# Patient Record
Sex: Male | Born: 1970
Health system: Southern US, Community
[De-identification: ages and names within clinical notes are randomized; demographics above are authoritative.]

---

## 2011-06-10 ENCOUNTER — Ambulatory Visit: Payer: PRIVATE HEALTH INSURANCE

## 2011-06-10 ENCOUNTER — Ambulatory Visit (INDEPENDENT_AMBULATORY_CARE_PROVIDER_SITE_OTHER): Payer: PRIVATE HEALTH INSURANCE | Admitting: Family Medicine

## 2011-06-10 DIAGNOSIS — R059 Cough, unspecified: Secondary | ICD-10-CM

## 2011-06-10 DIAGNOSIS — R05 Cough: Secondary | ICD-10-CM

## 2011-06-10 DIAGNOSIS — J309 Allergic rhinitis, unspecified: Secondary | ICD-10-CM

## 2011-06-10 DIAGNOSIS — M79674 Pain in right toe(s): Secondary | ICD-10-CM

## 2011-06-10 DIAGNOSIS — M79609 Pain in unspecified limb: Secondary | ICD-10-CM

## 2011-06-10 DIAGNOSIS — S90129A Contusion of unspecified lesser toe(s) without damage to nail, initial encounter: Secondary | ICD-10-CM

## 2011-06-10 NOTE — Patient Instructions (Addendum)
Try over the counter zyrtec or allegra for allergies, can try zantac for possible reflux.  If cough/chest symptoms not improved in next 2-3 weeks, return to clinic.  There was not an obvious fracture of your toe, but a small one may be present.  Keep toe buddy taped to the 3rd toe for the next 3-4 weeks, and a hard soled shoe if possible.  Return to the clinic or go to the nearest emergency room if any of your symptoms worsen or new symptoms occur.

## 2011-06-10 NOTE — Progress Notes (Signed)
  Subjective:    Patient ID: Danny Frost, male    DOB: November 14, 1970, 41 y.o.   MRN: [id number]  HPI Danny Frost is a 41 y.o. male  Right 4th toe pain, jammed on side/top of bed frame last night.  Noticed bruising this am.  Tx: ibuprofen, elevated.  Chest congestion x 1 month.  Back exercising, hx of dust allergies and nasal congestion.  Occasional cough and clearing throat. Not SOB, or wheezing with exercise.  Notices more at rest. Clearing of throat.  Denies heartburn. No chest pain.   Tx: none.     Review of Systems  Constitutional: Negative for fever, chills, activity change and appetite change.  HENT: Positive for congestion, postnasal drip and sinus pressure. Negative for hearing loss, ear pain, sore throat and sneezing.   Respiratory: Positive for cough. Negative for shortness of breath and wheezing.        Dry cough.  Cardiovascular: Negative for chest pain and leg swelling.  Musculoskeletal:       R 4th toe pain.       Objective:   Physical Exam  Constitutional: He is oriented to person, place, and time. He appears well-developed and well-nourished.  HENT:  Head: Normocephalic and atraumatic.  Right Ear: Hearing, tympanic membrane and external ear normal.  Left Ear: Hearing, tympanic membrane and external ear normal.  Nose: Mucosal edema and rhinorrhea present. Right sinus exhibits no maxillary sinus tenderness and no frontal sinus tenderness. Left sinus exhibits no maxillary sinus tenderness and no frontal sinus tenderness.  Mouth/Throat: Oropharynx is clear and moist.  Eyes: Conjunctivae and EOM are normal. Pupils are equal, round, and reactive to light.  Neck: Normal range of motion. Neck supple.  Cardiovascular: Normal rate, regular rhythm, normal heart sounds and intact distal pulses.   No murmur heard. Pulmonary/Chest: Effort normal and breath sounds normal. No respiratory distress. He has no wheezes. He has no rales.  Musculoskeletal:       Right foot: He exhibits  tenderness and bony tenderness. He exhibits normal range of motion.       Ecchymosis, sts mid 4th toe.  Neurological: He is alert and oriented to person, place, and time.  Skin: Skin is warm and dry.  Psychiatric: He has a normal mood and affect. His behavior is normal.   [facility] reading (PRIMARY) by  Dr. Neva Seat:  R 4th toe - possible faint/nondispalced fx of distal phalyn.      Assessment & Plan:  Danny Frost is a 41 y.o. male 1. Allergic rhinitis    2. Cough    3. Toe pain, right  DG Toe 4th Right   Toe contusion vs.subtle ND fx.  - buddy tape to 3rd for 3-4 weeks, hard soled shoe, tylenol otc prn, XR to overread.  Suspect cough/throat clearing PND with AR vs LPR.  Trial otc allegra or zyrtec QD +/- zantac.  Recheck 2-3 weeks if not improving, sooner if worse.

## 2013-02-26 IMAGING — CR DG TOE 4TH 2+V*R*
1 series · 1 of 1 positions shown · non-contrast
Comparison: None.

CLINICAL DATA: Pain and bruising in the fourth toe following injury
yesterday.

RIGHT FOURTH TOE

[AP]
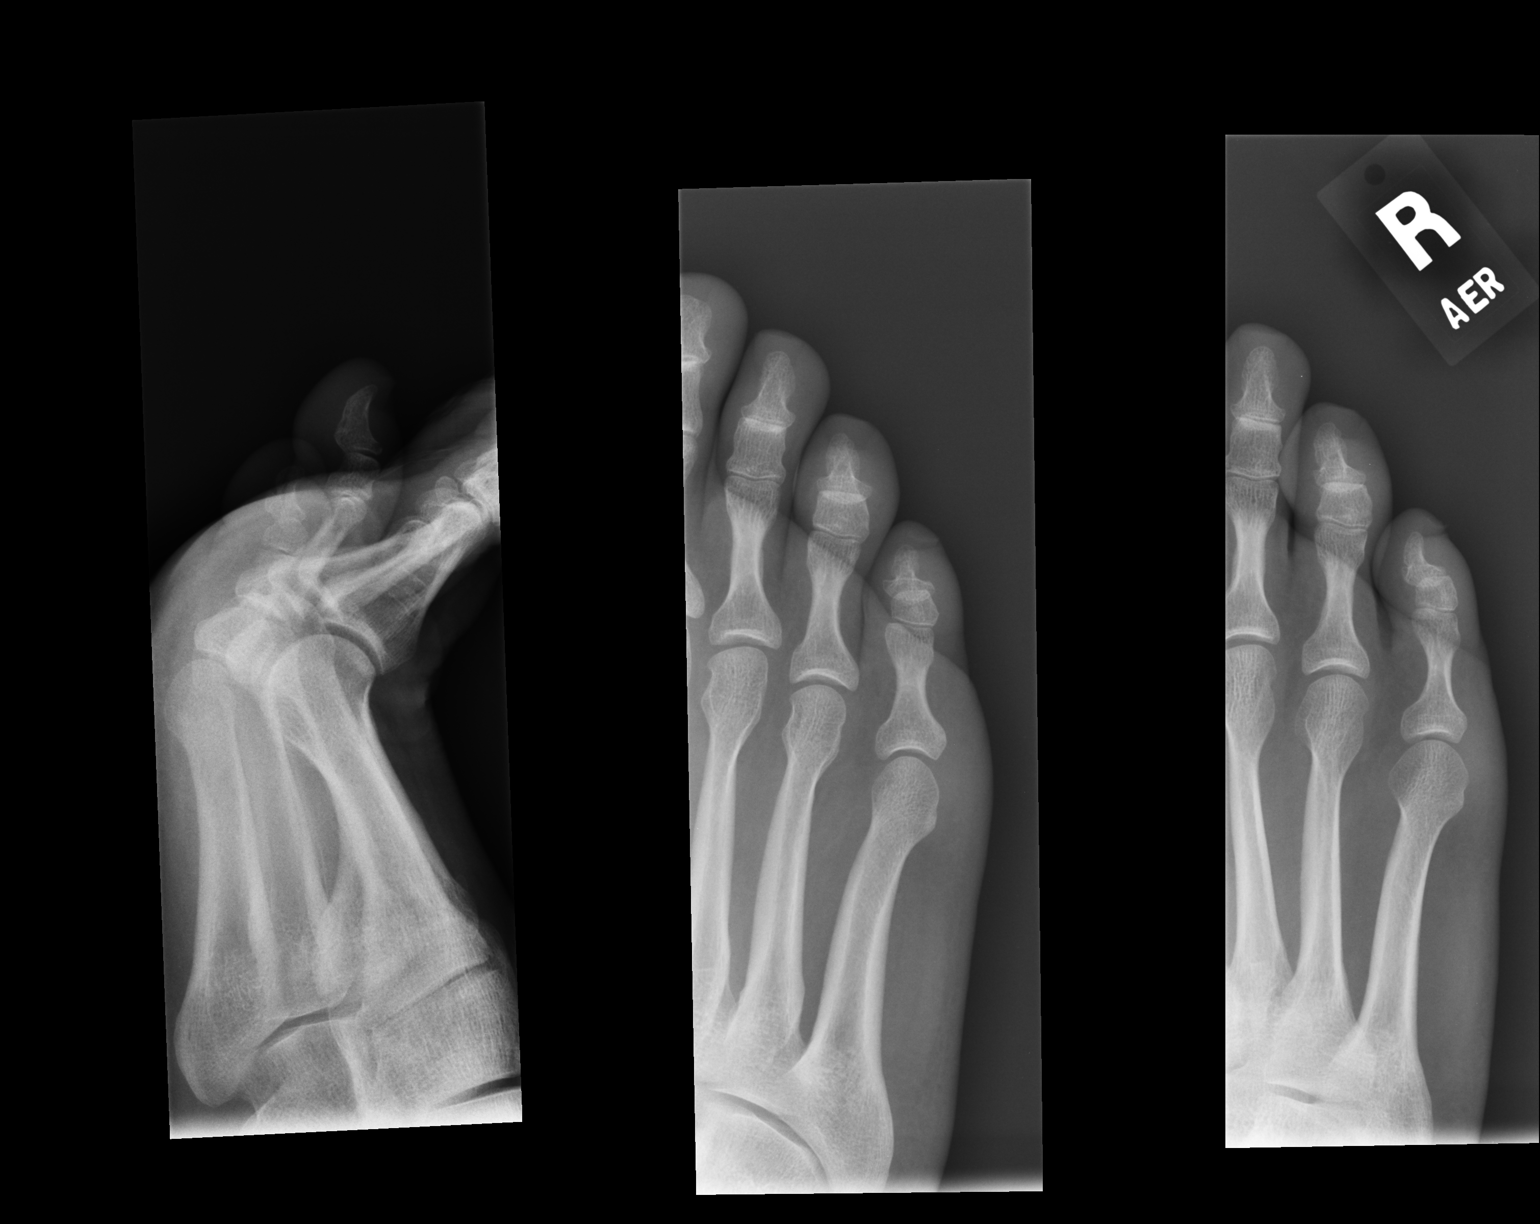

[1 of 1 positions shown; findings below may reference images not displayed]

FINDINGS: There is soft tissue swelling in the fourth toe.  The
mineralization and alignment are normal.  There is no evidence of
acute fracture or dislocation.
IMPRESSION: No acute osseous findings identified.

## 2015-08-22 DIAGNOSIS — H43813 Vitreous degeneration, bilateral: Secondary | ICD-10-CM | POA: Diagnosis not present

## 2015-08-22 DIAGNOSIS — H43393 Other vitreous opacities, bilateral: Secondary | ICD-10-CM | POA: Diagnosis not present

## 2015-08-22 DIAGNOSIS — H5319 Other subjective visual disturbances: Secondary | ICD-10-CM | POA: Diagnosis not present

## 2015-08-22 DIAGNOSIS — H00025 Hordeolum internum left lower eyelid: Secondary | ICD-10-CM | POA: Diagnosis not present

## 2015-09-22 DIAGNOSIS — H5319 Other subjective visual disturbances: Secondary | ICD-10-CM | POA: Diagnosis not present

## 2015-09-22 DIAGNOSIS — H43813 Vitreous degeneration, bilateral: Secondary | ICD-10-CM | POA: Diagnosis not present

## 2015-09-22 DIAGNOSIS — H43393 Other vitreous opacities, bilateral: Secondary | ICD-10-CM | POA: Diagnosis not present

## 2015-09-22 DIAGNOSIS — H00025 Hordeolum internum left lower eyelid: Secondary | ICD-10-CM | POA: Diagnosis not present

## 2015-11-14 DIAGNOSIS — Z0000 Encounter for general adult medical examination without abnormal findings: Secondary | ICD-10-CM | POA: Diagnosis not present

## 2015-11-14 DIAGNOSIS — Z125 Encounter for screening for malignant neoplasm of prostate: Secondary | ICD-10-CM | POA: Diagnosis not present

## 2015-11-21 DIAGNOSIS — Z0001 Encounter for general adult medical examination with abnormal findings: Secondary | ICD-10-CM | POA: Diagnosis not present

## 2016-01-23 DIAGNOSIS — H01003 Unspecified blepharitis right eye, unspecified eyelid: Secondary | ICD-10-CM | POA: Diagnosis not present

## 2016-01-23 DIAGNOSIS — H40011 Open angle with borderline findings, low risk, right eye: Secondary | ICD-10-CM | POA: Diagnosis not present

## 2016-01-23 DIAGNOSIS — H40012 Open angle with borderline findings, low risk, left eye: Secondary | ICD-10-CM | POA: Diagnosis not present

## 2016-01-23 DIAGNOSIS — H00025 Hordeolum internum left lower eyelid: Secondary | ICD-10-CM | POA: Diagnosis not present

## 2016-01-23 DIAGNOSIS — H40013 Open angle with borderline findings, low risk, bilateral: Secondary | ICD-10-CM | POA: Diagnosis not present

## 2016-07-17 DIAGNOSIS — H43813 Vitreous degeneration, bilateral: Secondary | ICD-10-CM | POA: Diagnosis not present

## 2016-07-17 DIAGNOSIS — H43393 Other vitreous opacities, bilateral: Secondary | ICD-10-CM | POA: Diagnosis not present

## 2016-07-17 DIAGNOSIS — H40012 Open angle with borderline findings, low risk, left eye: Secondary | ICD-10-CM | POA: Diagnosis not present

## 2016-07-17 DIAGNOSIS — H01003 Unspecified blepharitis right eye, unspecified eyelid: Secondary | ICD-10-CM | POA: Diagnosis not present

## 2016-07-17 DIAGNOSIS — H40013 Open angle with borderline findings, low risk, bilateral: Secondary | ICD-10-CM | POA: Diagnosis not present

## 2016-07-17 DIAGNOSIS — H40011 Open angle with borderline findings, low risk, right eye: Secondary | ICD-10-CM | POA: Diagnosis not present

## 2016-10-30 DIAGNOSIS — L814 Other melanin hyperpigmentation: Secondary | ICD-10-CM | POA: Diagnosis not present

## 2016-10-30 DIAGNOSIS — D1801 Hemangioma of skin and subcutaneous tissue: Secondary | ICD-10-CM | POA: Diagnosis not present

## 2016-10-30 DIAGNOSIS — D225 Melanocytic nevi of trunk: Secondary | ICD-10-CM | POA: Diagnosis not present

## 2016-10-30 DIAGNOSIS — L821 Other seborrheic keratosis: Secondary | ICD-10-CM | POA: Diagnosis not present

## 2016-11-14 DIAGNOSIS — Z125 Encounter for screening for malignant neoplasm of prostate: Secondary | ICD-10-CM | POA: Diagnosis not present

## 2016-11-14 DIAGNOSIS — Z0001 Encounter for general adult medical examination with abnormal findings: Secondary | ICD-10-CM | POA: Diagnosis not present

## 2016-12-05 ENCOUNTER — Encounter: Payer: Self-pay | Admitting: Hematology and Oncology

## 2016-12-05 ENCOUNTER — Telehealth: Payer: Self-pay | Admitting: Hematology and Oncology

## 2016-12-05 NOTE — Telephone Encounter (Signed)
Appt has been scheduled for the Danny Frost to see Dr. Pamelia HoitGudena on 9/13 at 4pm. [facility] the Danny Frost. Letter mailed.

## 2016-12-20 ENCOUNTER — Encounter: Payer: Self-pay | Admitting: Hematology and Oncology

## 2016-12-20 ENCOUNTER — Ambulatory Visit (HOSPITAL_BASED_OUTPATIENT_CLINIC_OR_DEPARTMENT_OTHER): Payer: Self-pay | Admitting: Hematology and Oncology

## 2016-12-20 DIAGNOSIS — D72819 Decreased white blood cell count, unspecified: Secondary | ICD-10-CM

## 2016-12-20 DIAGNOSIS — D696 Thrombocytopenia, unspecified: Secondary | ICD-10-CM

## 2016-12-20 NOTE — Assessment & Plan Note (Signed)
Mild thrombocytopenia platelet count 134 Mild leukopenia white blood cell count 3.7 and ANC 2.2  I discussed with the patient extensively the differential diagnosis for both of these conditions. The differential diagnosis includes medications, infections, autoimmune conditions, bone marrow disorders.  Because the abnormality is extremely small, I recommended watchful monitoring at this time. Patient is completely asymptomatic and does not seem to have any problems or concerns.  Return to clinic in 3 months with labs and follow-up.

## 2016-12-20 NOTE — Progress Notes (Addendum)
[facility] CONSULT NOTE  No care team member to display  CHIEF COMPLAINTS/PURPOSE OF CONSULTATION:  Thrombocytopenia and leukopenia  HISTORY OF PRESENTING ILLNESS:  Danny Frost 46 y.o. male is here because of recent diagnosis of mild thrombocytopenia and mild leukopenia. Patient had blood work every year. This year blood work was done in August which revealed a white count 3.7 and a platelet count of 134. Year ago both of these not values were normal. Because of these 2 he was sent to us for further evaluation. Patient has a history of asthma but other than that is fairly healthy. Does not take many medications. Denies any recent illnesses or infections.  I reviewed her records extensively and collaborated the history with the patient.   MEDICAL HISTORY:  Asthma SURGICAL HISTORY: No prior surgeries SOCIAL HISTORY: Denies any tobacco alcohol integration drug use FAMILY HISTORY: No family history of blood disorders ALLERGIES:  has No Known Allergies.  MEDICATIONS: Fish oil, fluticasone, Mucinex, Delsym, ketoconazole 2% cream REVIEW OF SYSTEMS:   Constitutional: Denies fevers, chills or abnormal night sweats Eyes: Denies blurriness of vision, double vision or watery eyes Ears, nose, mouth, throat, and face: Denies mucositis or sore throat Respiratory: Denies cough, dyspnea or wheezes Cardiovascular: Denies palpitation, chest discomfort or lower extremity swelling Gastrointestinal:  Denies nausea, heartburn or change in bowel habits Skin: Denies abnormal skin rashes Lymphatics: Denies new lymphadenopathy or easy bruising Neurological:Denies numbness, tingling or new weaknesses Behavioral/Psych: Mood is stable, no new changes   All other systems were reviewed with the patient and are negative.  PHYSICAL EXAMINATION: ECOG PERFORMANCE STATUS: 0 - Asymptomatic  Vitals:   12/20/16 1615  BP: (!) 93/51  Pulse: 65  Resp: 18  Temp: 98.2 F (36.8 C)  SpO2: 98%    Filed Weights   12/20/16 1615  Weight: 191 lb 1.6 oz (86.7 kg)    GENERAL:alert, no distress and comfortable SKIN: skin color, texture, turgor are normal, no rashes or significant lesions EYES: normal, conjunctiva are pink and non-injected, sclera clear OROPHARYNX:no exudate, no erythema and lips, buccal mucosa, and tongue normal  NECK: supple, thyroid normal size, non-tender, without nodularity LYMPH:  no palpable lymphadenopathy in the cervical, axillary or inguinal LUNGS: clear to auscultation and percussion with normal breathing effort HEART: regular rate & rhythm and no murmurs and no lower extremity edema ABDOMEN:abdomen soft, non-tender and normal bowel sounds Musculoskeletal:no cyanosis of digits and no clubbing  PSYCH: alert & oriented x 3 with fluent speech NEURO: no focal motor/sensory deficits  LABORATORY DATA:  CBC is indicative of a white count of 3.7, ANC 2.2 in August 2018, platelets 134  ASSESSMENT AND PLAN:  Thrombocytopenia (HCC) Mild thrombocytopenia platelet count 134 Mild leukopenia white blood cell count 3.7 and ANC 2.2  I discussed with the patient extensively the differential diagnosis for both of these conditions. The differential diagnosis includes medications, infections, autoimmune conditions, bone marrow disorders.  Because the abnormality is extremely small, I recommended watchful monitoring at this time. Patient is completely asymptomatic and does not seem to have any problems or concerns.  Return to clinic in 3 months with labs and follow-up.   All questions were answered. The patient knows to call the clinic with any problems, questions or concerns.    Sabas SousGudena, [name], MD 12/20/16

## 2017-01-02 DIAGNOSIS — H00025 Hordeolum internum left lower eyelid: Secondary | ICD-10-CM | POA: Diagnosis not present

## 2017-01-02 DIAGNOSIS — H00021 Hordeolum internum right upper eyelid: Secondary | ICD-10-CM | POA: Diagnosis not present

## 2017-01-02 DIAGNOSIS — H01003 Unspecified blepharitis right eye, unspecified eyelid: Secondary | ICD-10-CM | POA: Diagnosis not present

## 2017-02-15 DIAGNOSIS — H00025 Hordeolum internum left lower eyelid: Secondary | ICD-10-CM | POA: Diagnosis not present

## 2017-02-15 DIAGNOSIS — H40011 Open angle with borderline findings, low risk, right eye: Secondary | ICD-10-CM | POA: Diagnosis not present

## 2017-02-15 DIAGNOSIS — H40013 Open angle with borderline findings, low risk, bilateral: Secondary | ICD-10-CM | POA: Diagnosis not present

## 2017-02-15 DIAGNOSIS — H40012 Open angle with borderline findings, low risk, left eye: Secondary | ICD-10-CM | POA: Diagnosis not present

## 2017-02-15 DIAGNOSIS — H00021 Hordeolum internum right upper eyelid: Secondary | ICD-10-CM | POA: Diagnosis not present

## 2017-02-15 DIAGNOSIS — H01003 Unspecified blepharitis right eye, unspecified eyelid: Secondary | ICD-10-CM | POA: Diagnosis not present

## 2017-03-21 ENCOUNTER — Ambulatory Visit (HOSPITAL_BASED_OUTPATIENT_CLINIC_OR_DEPARTMENT_OTHER): Payer: Self-pay | Admitting: Hematology and Oncology

## 2017-03-21 ENCOUNTER — Other Ambulatory Visit (HOSPITAL_BASED_OUTPATIENT_CLINIC_OR_DEPARTMENT_OTHER): Payer: Self-pay

## 2017-03-21 DIAGNOSIS — D696 Thrombocytopenia, unspecified: Secondary | ICD-10-CM

## 2017-03-21 LAB — CBC WITH DIFFERENTIAL/PLATELET
BASO%: 0.5 % (ref 0.0–2.0)
Basophils Absolute: 0 10*3/uL (ref 0.0–0.1)
EOS%: 2.3 % (ref 0.0–7.0)
Eosinophils Absolute: 0.1 10*3/uL (ref 0.0–0.5)
HCT: 45.5 % (ref 38.4–49.9)
HGB: 15.3 g/dL (ref 13.0–17.1)
LYMPH%: 28.2 % (ref 14.0–49.0)
MCH: 29.6 pg (ref 27.2–33.4)
MCHC: 33.7 g/dL (ref 32.0–36.0)
MCV: 87.9 fL (ref 79.3–98.0)
MONO#: 0.4 10*3/uL (ref 0.1–0.9)
MONO%: 8 % (ref 0.0–14.0)
NEUT%: 61 % (ref 39.0–75.0)
NEUTROS ABS: 2.8 10*3/uL (ref 1.5–6.5)
Platelets: 137 10*3/uL — ABNORMAL LOW (ref 140–400)
RBC: 5.18 10*6/uL (ref 4.20–5.82)
RDW: 13 % (ref 11.0–14.6)
WBC: 4.6 10*3/uL (ref 4.0–10.3)
lymph#: 1.3 10*3/uL (ref 0.9–3.3)

## 2017-03-21 NOTE — Assessment & Plan Note (Signed)
Mild thrombocytopenia platelet count 134 Mild leukopenia white blood cell count 3.7 and ANC 2.2  The differential diagnosis includes medications, infections, autoimmune conditions, bone marrow disorders.  Because the abnormality is extremely small, I recommended watchful monitoring at this time. Patient is completely asymptomatic and does not seem to have any problems or concerns.  Return to clinic in 3 months with labs and follow-up.

## 2017-03-21 NOTE — Progress Notes (Signed)
Patient Care Team: Patient, No Pcp Per as PCP - General (General Practice)  DIAGNOSIS:  Encounter Diagnosis  Name Primary?  . Thrombocytopenia ([place])    CHIEF COMPLIANT: Follow-up of thrombocytopenia and mild leukopenia  INTERVAL HISTORY: Danny Frost is a 46 year old with above-mentioned history of mild thrombus cytopenia who is here for 41-monthfollow-up.  He has not noticed any new symptoms or concerns.  REVIEW OF SYSTEMS:   Constitutional: Denies fevers, chills or abnormal weight loss Eyes: Denies blurriness of vision Ears, nose, mouth, throat, and face: Denies mucositis or sore throat Respiratory: Denies cough, dyspnea or wheezes Cardiovascular: Denies palpitation, chest discomfort Gastrointestinal:  Denies nausea, heartburn or change in bowel habits Skin: Denies abnormal skin rashes Lymphatics: Denies new lymphadenopathy or easy bruising Neurological:Denies numbness, tingling or new weaknesses Behavioral/Psych: Mood is stable, no new changes  Extremities: No lower extremity edema  All other systems were reviewed with the patient and are negative.  I have reviewed the past medical history, past surgical history, social history and family history with the patient and they are unchanged from previous note.  ALLERGIES:  has No Known Allergies.  MEDICATIONS:  No current outpatient medications on file.   No current facility-administered medications for this visit.     PHYSICAL EXAMINATION: ECOG PERFORMANCE STATUS: 0 - Asymptomatic  Vitals:   03/21/17 1049  BP: 111/62  Pulse: 72  Resp: 20  Temp: 97.8 F (36.6 C)  SpO2: 99%   Filed Weights   03/21/17 1049  Weight: 195 lb 14.4 oz (88.9 kg)    GENERAL:alert, no distress and comfortable SKIN: skin color, texture, turgor are normal, no rashes or significant lesions EYES: normal, Conjunctiva are pink and non-injected, sclera clear OROPHARYNX:no exudate, no erythema and lips, buccal mucosa, and tongue normal    NECK: supple, thyroid normal size, non-tender, without nodularity LYMPH:  no palpable lymphadenopathy in the cervical, axillary or inguinal LUNGS: clear to auscultation and percussion with normal breathing effort HEART: regular rate & rhythm and no murmurs and no lower extremity edema ABDOMEN:abdomen soft, non-tender and normal bowel sounds MUSCULOSKELETAL:no cyanosis of digits and no clubbing  NEURO: alert & oriented x 3 with fluent speech, no focal motor/sensory deficits EXTREMITIES: No lower extremity edema  LABORATORY DATA:  I have reviewed the data as listed   Chemistry   No results found for: NA, K, CL, CO2, BUN, CREATININE, GLU No results found for: CALCIUM, ALKPHOS, AST, ALT, BILITOT     Lab Results  Component Value Date   WBC 4.6 03/21/2017   HGB 15.3 03/21/2017   HCT 45.5 03/21/2017   MCV 87.9 03/21/2017   PLT 137 (L) 03/21/2017   NEUTROABS 2.8 03/21/2017    ASSESSMENT & PLAN:  Thrombocytopenia (HCC) Mild thrombocytopenia platelet count 134 Mild leukopenia white blood cell count 3.7 and ANC 2.2  Blood work review 03/21/2017: Platelet count 137 stable WBC count 4.6, ANC 2.8 normal  The differential diagnosis includes medications, infections, autoimmune conditions, bone marrow disorders.  Because the abnormality is extremely small, I recommended watchful monitoring at this time. Patient is completely asymptomatic and does not seem to have any problems or concerns.  I instructed him to follow with his primary care physician once a year for blood work.  If the platelet count drops below 100 I would like to see him back and we will consider doing a bone marrow biopsy at that time.  Return to clinic on an as-needed basis. I spent 15 minutes talking to the patient of  which more than half was spent in counseling and coordination of care.  No orders of the defined types were placed in this encounter.  The patient has a good understanding of the overall plan. he agrees  with it. he will call with any problems that may develop before the next visit here.   Rulon Eisenmenger, MD 03/21/17

## 2017-06-19 DIAGNOSIS — R5383 Other fatigue: Secondary | ICD-10-CM | POA: Diagnosis not present

## 2017-06-19 DIAGNOSIS — R300 Dysuria: Secondary | ICD-10-CM | POA: Diagnosis not present

## 2017-07-18 DIAGNOSIS — H47321 Drusen of optic disc, right eye: Secondary | ICD-10-CM | POA: Diagnosis not present

## 2017-07-18 DIAGNOSIS — H40013 Open angle with borderline findings, low risk, bilateral: Secondary | ICD-10-CM | POA: Diagnosis not present

## 2017-07-18 DIAGNOSIS — H43813 Vitreous degeneration, bilateral: Secondary | ICD-10-CM | POA: Diagnosis not present

## 2017-07-18 DIAGNOSIS — H524 Presbyopia: Secondary | ICD-10-CM | POA: Diagnosis not present

## 2017-07-18 DIAGNOSIS — H01003 Unspecified blepharitis right eye, unspecified eyelid: Secondary | ICD-10-CM | POA: Diagnosis not present

## 2017-10-31 DIAGNOSIS — D225 Melanocytic nevi of trunk: Secondary | ICD-10-CM | POA: Diagnosis not present

## 2017-10-31 DIAGNOSIS — D2339 Other benign neoplasm of skin of other parts of face: Secondary | ICD-10-CM | POA: Diagnosis not present

## 2017-10-31 DIAGNOSIS — L738 Other specified follicular disorders: Secondary | ICD-10-CM | POA: Diagnosis not present

## 2017-10-31 DIAGNOSIS — D485 Neoplasm of uncertain behavior of skin: Secondary | ICD-10-CM | POA: Diagnosis not present

## 2017-10-31 DIAGNOSIS — D2239 Melanocytic nevi of other parts of face: Secondary | ICD-10-CM | POA: Diagnosis not present

## 2017-11-18 DIAGNOSIS — R5383 Other fatigue: Secondary | ICD-10-CM | POA: Diagnosis not present

## 2017-11-18 DIAGNOSIS — Z0000 Encounter for general adult medical examination without abnormal findings: Secondary | ICD-10-CM | POA: Diagnosis not present

## 2017-11-18 DIAGNOSIS — Z125 Encounter for screening for malignant neoplasm of prostate: Secondary | ICD-10-CM | POA: Diagnosis not present

## 2017-11-25 DIAGNOSIS — J45909 Unspecified asthma, uncomplicated: Secondary | ICD-10-CM | POA: Diagnosis not present

## 2017-11-25 DIAGNOSIS — Z0000 Encounter for general adult medical examination without abnormal findings: Secondary | ICD-10-CM | POA: Diagnosis not present

## 2017-11-25 DIAGNOSIS — D696 Thrombocytopenia, unspecified: Secondary | ICD-10-CM | POA: Diagnosis not present

## 2017-12-03 DIAGNOSIS — E291 Testicular hypofunction: Secondary | ICD-10-CM | POA: Diagnosis not present

## 2017-12-10 DIAGNOSIS — E291 Testicular hypofunction: Secondary | ICD-10-CM | POA: Diagnosis not present

## 2017-12-13 DIAGNOSIS — R0789 Other chest pain: Secondary | ICD-10-CM | POA: Diagnosis not present

## 2017-12-13 DIAGNOSIS — R42 Dizziness and giddiness: Secondary | ICD-10-CM | POA: Diagnosis not present

## 2017-12-13 DIAGNOSIS — R002 Palpitations: Secondary | ICD-10-CM | POA: Diagnosis not present

## 2017-12-27 DIAGNOSIS — E559 Vitamin D deficiency, unspecified: Secondary | ICD-10-CM | POA: Diagnosis not present

## 2017-12-27 DIAGNOSIS — Z23 Encounter for immunization: Secondary | ICD-10-CM | POA: Diagnosis not present

## 2017-12-27 DIAGNOSIS — E291 Testicular hypofunction: Secondary | ICD-10-CM | POA: Diagnosis not present

## 2017-12-27 DIAGNOSIS — Z6824 Body mass index (BMI) 24.0-24.9, adult: Secondary | ICD-10-CM | POA: Diagnosis not present

## 2018-01-17 DIAGNOSIS — R42 Dizziness and giddiness: Secondary | ICD-10-CM | POA: Diagnosis not present

## 2018-01-17 DIAGNOSIS — R0789 Other chest pain: Secondary | ICD-10-CM | POA: Diagnosis not present

## 2018-07-07 DIAGNOSIS — D485 Neoplasm of uncertain behavior of skin: Secondary | ICD-10-CM | POA: Diagnosis not present

## 2018-07-22 DIAGNOSIS — H43813 Vitreous degeneration, bilateral: Secondary | ICD-10-CM | POA: Diagnosis not present

## 2018-07-22 DIAGNOSIS — H0102B Squamous blepharitis left eye, upper and lower eyelids: Secondary | ICD-10-CM | POA: Diagnosis not present

## 2018-07-22 DIAGNOSIS — D3132 Benign neoplasm of left choroid: Secondary | ICD-10-CM | POA: Diagnosis not present

## 2018-07-22 DIAGNOSIS — H524 Presbyopia: Secondary | ICD-10-CM | POA: Diagnosis not present

## 2018-07-22 DIAGNOSIS — H40013 Open angle with borderline findings, low risk, bilateral: Secondary | ICD-10-CM | POA: Diagnosis not present

## 2018-11-03 DIAGNOSIS — L814 Other melanin hyperpigmentation: Secondary | ICD-10-CM | POA: Diagnosis not present

## 2018-11-03 DIAGNOSIS — B070 Plantar wart: Secondary | ICD-10-CM | POA: Diagnosis not present

## 2018-11-03 DIAGNOSIS — D225 Melanocytic nevi of trunk: Secondary | ICD-10-CM | POA: Diagnosis not present

## 2018-11-03 DIAGNOSIS — D229 Melanocytic nevi, unspecified: Secondary | ICD-10-CM | POA: Diagnosis not present

## 2018-11-03 DIAGNOSIS — L821 Other seborrheic keratosis: Secondary | ICD-10-CM | POA: Diagnosis not present

## 2018-11-24 DIAGNOSIS — E559 Vitamin D deficiency, unspecified: Secondary | ICD-10-CM | POA: Diagnosis not present

## 2018-11-24 DIAGNOSIS — E039 Hypothyroidism, unspecified: Secondary | ICD-10-CM | POA: Diagnosis not present

## 2018-11-24 DIAGNOSIS — Z125 Encounter for screening for malignant neoplasm of prostate: Secondary | ICD-10-CM | POA: Diagnosis not present

## 2018-11-24 DIAGNOSIS — I10 Essential (primary) hypertension: Secondary | ICD-10-CM | POA: Diagnosis not present

## 2018-12-01 DIAGNOSIS — Z0000 Encounter for general adult medical examination without abnormal findings: Secondary | ICD-10-CM | POA: Diagnosis not present

## 2018-12-01 DIAGNOSIS — D709 Neutropenia, unspecified: Secondary | ICD-10-CM | POA: Diagnosis not present

## 2018-12-01 DIAGNOSIS — E559 Vitamin D deficiency, unspecified: Secondary | ICD-10-CM | POA: Diagnosis not present

## 2018-12-01 DIAGNOSIS — I950 Idiopathic hypotension: Secondary | ICD-10-CM | POA: Diagnosis not present

## 2019-02-09 ENCOUNTER — Other Ambulatory Visit: Payer: Self-pay

## 2019-02-09 DIAGNOSIS — Z20822 Contact with and (suspected) exposure to covid-19: Secondary | ICD-10-CM

## 2019-02-10 LAB — NOVEL CORONAVIRUS, NAA: SARS-CoV-2, NAA: NOT DETECTED

## 2019-03-03 DIAGNOSIS — E559 Vitamin D deficiency, unspecified: Secondary | ICD-10-CM | POA: Diagnosis not present

## 2019-03-03 DIAGNOSIS — I950 Idiopathic hypotension: Secondary | ICD-10-CM | POA: Diagnosis not present

## 2019-03-10 DIAGNOSIS — I950 Idiopathic hypotension: Secondary | ICD-10-CM | POA: Diagnosis not present

## 2019-03-10 DIAGNOSIS — E559 Vitamin D deficiency, unspecified: Secondary | ICD-10-CM | POA: Diagnosis not present

## 2019-03-12 ENCOUNTER — Other Ambulatory Visit: Payer: Self-pay

## 2019-03-12 DIAGNOSIS — Z20822 Contact with and (suspected) exposure to covid-19: Secondary | ICD-10-CM

## 2019-03-14 LAB — NOVEL CORONAVIRUS, NAA: SARS-CoV-2, NAA: DETECTED — AB

## 2019-03-15 ENCOUNTER — Telehealth: Payer: Self-pay

## 2019-03-15 NOTE — Telephone Encounter (Signed)
Pt given Covid-19 positive results. Discussed mild, moderate and severe symptoms. Advised pt to call 911 for any respiratory issues and/dehydration. Discussed non test criteria for ending self isolation. Pt advised of way to manage symptoms at home and review isolation precautions especially the importance of washing hands frequently and wearing a mask when around others. Pt verbalized understanding.  

## 2019-03-19 ENCOUNTER — Ambulatory Visit: Payer: Self-pay

## 2019-03-19 NOTE — Telephone Encounter (Signed)
Pt. Asking to review quarantine requirements and asking about retesting. Will quarantine x 10 days from beginning of symptoms . [facility] does not recommend testing before 90 days.

## 2019-06-24 DIAGNOSIS — I341 Nonrheumatic mitral (valve) prolapse: Secondary | ICD-10-CM | POA: Diagnosis not present

## 2019-06-29 ENCOUNTER — Ambulatory Visit: Payer: Self-pay | Attending: Internal Medicine

## 2019-06-29 DIAGNOSIS — Z20822 Contact with and (suspected) exposure to covid-19: Secondary | ICD-10-CM

## 2019-06-30 LAB — SARS-COV-2, NAA 2 DAY TAT

## 2019-06-30 LAB — NOVEL CORONAVIRUS, NAA: SARS-CoV-2, NAA: NOT DETECTED

## 2019-07-24 DIAGNOSIS — H04123 Dry eye syndrome of bilateral lacrimal glands: Secondary | ICD-10-CM | POA: Diagnosis not present

## 2019-07-24 DIAGNOSIS — H40013 Open angle with borderline findings, low risk, bilateral: Secondary | ICD-10-CM | POA: Diagnosis not present

## 2019-07-24 DIAGNOSIS — H524 Presbyopia: Secondary | ICD-10-CM | POA: Diagnosis not present

## 2019-11-02 DIAGNOSIS — B070 Plantar wart: Secondary | ICD-10-CM | POA: Diagnosis not present

## 2019-11-02 DIAGNOSIS — L814 Other melanin hyperpigmentation: Secondary | ICD-10-CM | POA: Diagnosis not present

## 2019-11-02 DIAGNOSIS — D225 Melanocytic nevi of trunk: Secondary | ICD-10-CM | POA: Diagnosis not present

## 2019-11-02 DIAGNOSIS — D1801 Hemangioma of skin and subcutaneous tissue: Secondary | ICD-10-CM | POA: Diagnosis not present

## 2019-12-02 DIAGNOSIS — E559 Vitamin D deficiency, unspecified: Secondary | ICD-10-CM | POA: Diagnosis not present

## 2019-12-02 DIAGNOSIS — E785 Hyperlipidemia, unspecified: Secondary | ICD-10-CM | POA: Diagnosis not present

## 2019-12-02 DIAGNOSIS — Z0000 Encounter for general adult medical examination without abnormal findings: Secondary | ICD-10-CM | POA: Diagnosis not present

## 2019-12-02 DIAGNOSIS — I10 Essential (primary) hypertension: Secondary | ICD-10-CM | POA: Diagnosis not present

## 2019-12-02 DIAGNOSIS — Z125 Encounter for screening for malignant neoplasm of prostate: Secondary | ICD-10-CM | POA: Diagnosis not present

## 2019-12-25 DIAGNOSIS — Z0000 Encounter for general adult medical examination without abnormal findings: Secondary | ICD-10-CM | POA: Diagnosis not present

## 2020-03-09 DIAGNOSIS — Z20822 Contact with and (suspected) exposure to covid-19: Secondary | ICD-10-CM | POA: Diagnosis not present

## 2020-05-10 DIAGNOSIS — H6120 Impacted cerumen, unspecified ear: Secondary | ICD-10-CM | POA: Diagnosis not present

## 2020-05-10 DIAGNOSIS — H9311 Tinnitus, right ear: Secondary | ICD-10-CM | POA: Diagnosis not present

## 2020-05-10 DIAGNOSIS — R0982 Postnasal drip: Secondary | ICD-10-CM | POA: Diagnosis not present

## 2020-05-24 DIAGNOSIS — Z1211 Encounter for screening for malignant neoplasm of colon: Secondary | ICD-10-CM | POA: Diagnosis not present

## 2020-07-04 DIAGNOSIS — Z1211 Encounter for screening for malignant neoplasm of colon: Secondary | ICD-10-CM | POA: Diagnosis not present

## 2020-07-11 DIAGNOSIS — Z1211 Encounter for screening for malignant neoplasm of colon: Secondary | ICD-10-CM | POA: Diagnosis not present

## 2020-07-11 DIAGNOSIS — K5730 Diverticulosis of large intestine without perforation or abscess without bleeding: Secondary | ICD-10-CM | POA: Diagnosis not present

## 2020-09-27 DIAGNOSIS — D3132 Benign neoplasm of left choroid: Secondary | ICD-10-CM | POA: Diagnosis not present

## 2020-09-27 DIAGNOSIS — H40013 Open angle with borderline findings, low risk, bilateral: Secondary | ICD-10-CM | POA: Diagnosis not present

## 2020-09-27 DIAGNOSIS — H04123 Dry eye syndrome of bilateral lacrimal glands: Secondary | ICD-10-CM | POA: Diagnosis not present

## 2020-10-25 DIAGNOSIS — H5319 Other subjective visual disturbances: Secondary | ICD-10-CM | POA: Diagnosis not present

## 2020-10-27 NOTE — Progress Notes (Signed)
[facility] - Clinic Note  10/28/2020     CHIEF COMPLAINT Patient presents for Retina Evaluation   HISTORY OF PRESENT ILLNESS: Danny Frost is a 50 y.o. male who presents to the clinic today for:   HPI     Retina Evaluation   In both eyes.  This started 2.  Duration of 2.  Associated Symptoms Flashes.        Comments   Patient here for Retina Evaluation. Referred by Dr Alben Spittle. Patient states vision fine. Has issue recently. Sees like the after effect of bright light like when camera flashes. Started 2 weeks ago OD and happened yesterday in OS. No eye pain. Dr Alben Spittle saw retinal whitening.       Last edited by [name], COA on 10/28/2020  9:38 AM.    Pt is here on the referral of Dr. Alben Spittle for concern of retinal whitening /  visual disturbances, pt states he is seeing a crescent shape in his right eye vision inferiorly that looks like the aftermath of a camera flash, he states he saw one flash in his left eye yesterday that seemed like the same type of phenomenon, pt denies taking any daily medications, no hx of heart problems, he states his older brother just tested positive for factor 5 leiden that causes blood clots, pt states he is going to get tested now as well, pt states he used to see Dr. Elmer Picker for this same issue and was sent to a retina specialist years ago who did not find anything wrong with his retina  Referring physician: Dimitri Ped, MD [address] [place],  [place] [address]  HISTORICAL INFORMATION:   Selected notes from the MEDICAL RECORD NUMBER Referred by [name] for concern of retinal whitening and visual disturbances LEE:  Ocular Hx- PMH-    CURRENT MEDICATIONS: No current outpatient medications on file. (Ophthalmic Drugs)   No current facility-administered medications for this visit. (Ophthalmic Drugs)   No current outpatient medications on file. (Other)   No current facility-administered  medications for this visit. (Other)      REVIEW OF SYSTEMS: ROS   Positive for: Eyes Last edited by [name], COA on 10/28/2020  9:38 AM.       ALLERGIES No Known Allergies  PAST MEDICAL HISTORY History reviewed. No pertinent past medical history. History reviewed. No pertinent surgical history.  FAMILY HISTORY History reviewed. No pertinent family history.  SOCIAL HISTORY Social History   Tobacco Use   Smoking status: Never         OPHTHALMIC EXAM:  Base Eye Exam     Visual Acuity (Snellen - Linear)       Right Left   Dist cc 20/20 20/20    Correction: Glasses         Tonometry (Tonopen, 9:33 AM)       Right Left   Pressure 18 18         Pupils       Dark Light Shape React APD   Right 4 3 Round Brisk None   Left 4 3 Round Brisk None         Visual Fields (Counting fingers)       Left Right    Full Full         Extraocular Movement       Right Left    Full, Ortho Full, Ortho  Neuro/Psych     Oriented x3: Yes   Mood/Affect: Normal         Dilation     Both eyes: 1.0% Mydriacyl, 2.5% Phenylephrine @ 9:33 AM           Slit Lamp and Fundus Exam     Slit Lamp Exam       Right Left   Lids/Lashes Meibomian gland dysfunction, Telangiectasia Meibomian gland dysfunction, Telangiectasia   Conjunctiva/Sclera White and quiet White and quiet   Cornea Trace tear film debris Trace tear film debris   Anterior Chamber Deep and quiet Deep and quiet   Iris Round and dilated Round and dilated   Lens 1+ Nuclear sclerosis, 1+ Cortical cataract 1+ Nuclear sclerosis, 1+ Cortical cataract   Vitreous Vitreous syneresis Vitreous syneresis         Fundus Exam       Right Left   Disc Pink and Sharp, mild tilt, mild PPA Pink and Sharp, mild tilt, mild PPA   C/D Ratio 0.2 0.2   Macula Flat, Good foveal reflex, RPE mottling, focal CWS superior macula just inside ST arcades Flat, Good foveal reflex, RPE mottling,  ?nevus   Vessels Attenuated and Tortuous arterioles, mild Copper wiring Attenuated and Tortuous arterioles, mild Copper wiring, mild AV crossing changes   Periphery Attached, no heme    Attached, peripheral cystoid degeneration, No heme            Refraction     Wearing Rx       Sphere Cylinder Axis   Right -5.50 +1.25 126   Left -5.50 +1.25 034         Manifest Refraction (Auto)       Sphere Cylinder Axis Dist VA   Right -4.75 +1.25 135 20/20   Left -5.00 +1.00 037 20/20            IMAGING AND PROCEDURES  Imaging and Procedures for 10/28/2020  OCT, Retina - OU - Both Eyes       Right Eye Quality was good. Central Foveal Thickness: 301. Progression has no prior data. Findings include normal foveal contour, no IRF, no SRF, intraretinal hyper-reflective material (Focal IRHM superior macula along ST arcades caught on widefield).   Left Eye Quality was good. Central Foveal Thickness: 298. Progression has no prior data. Findings include normal foveal contour, no IRF, no SRF, vitreomacular adhesion .   Notes *Images captured and stored on drive  Diagnosis / Impression:  NFP, no IRF/SRF OU OD: Focal IRHM superior macula along ST arcades caught on widefield -- CWS  Clinical management:  See below  Abbreviations: NFP - Normal foveal profile. CME - cystoid macular edema. PED - pigment epithelial detachment. IRF - intraretinal fluid. SRF - subretinal fluid. EZ - ellipsoid zone. ERM - epiretinal membrane. ORA - outer retinal atrophy. ORT - outer retinal tubulation. SRHM - subretinal hyper-reflective material. IRHM - intraretinal hyper-reflective material      Fluorescein Angiography Optos (Transit OD)       Right Eye Progression has no prior data. Early phase findings include delayed filling (Patchy choroidal filling). Mid/Late phase findings include vascular perfusion defect, staining (Mild vascular non perfusion far periphery temporal, focal staining of ST  arteriole just inside arcade).   Left Eye Progression has no prior data. Early phase findings include normal observations. Mid/Late phase findings include normal observations.   Notes **Images stored on drive**  Impression: OD: delayed filling w/ focal hyperfluorescent staining of superior arteriole just inside ST  arcades, corresponding to focal CWS on exam OS: normal study              ASSESSMENT/PLAN:    ICD-10-CM   1. Cotton wool spots  H35.81     2. Retinal ischemia  H35.82     3. Retinal edema  H35.81 OCT, Retina - OU - Both Eyes    4. Hypertensive retinopathy of both eyes  H35.033 Fluorescein Angiography Optos (Transit OD)    5. Combined forms of age-related cataract of both eyes  H25.813       1-3. Cotton Wool Spots, retinal ischemia OD  - pt with focal cotton wool spots just inside ST arcades  - pt reports history of visual disturbance in inferior visual field corresponding to focal CWS  - FA 7.22.22 shows delayed filling and mild focal hyperfluorescent staining of arteriole in area of CWS  - pt reports strong family history of blood clots and brother with recent diagnosis of Factor V Leiden mutation -- pt to get tested himself via PCP  - discussed findings, prognosis  - no ophthalmic intervention recommended at this time -- will monitor CWS, but encouraged pt to pursue systemic work up of possible hypercoagulability, given strong family history of blood clots  - f/u 6 weeks, DFE, OCT  4. Hypertensive retinopathy OU - discussed importance of tight BP control - monitor  5. Mixed Cataract OU - The symptoms of cataract, surgical options, and treatments and risks were discussed with patient. - discussed diagnosis and progression - not yet visually significant - monitor  Ophthalmic Meds Ordered this visit:  No orders of the defined types were placed in this encounter.      Return in about 6 weeks (around 12/09/2020) for f/u CWS OD, DFE, OCT.  There are  no Patient Instructions on file for this visit.   Explained the diagnoses, plan, and follow up with the patient and they expressed understanding.  Patient expressed understanding of the importance of proper follow up care.   Karie Chimera, M.D., Ph.D. Diseases & Surgery of the Retina and Vitreous [facility]  I have reviewed the above documentation for accuracy and completeness, and I agree with the above. Karie Chimera, M.D., Ph.D. 10/28/20 4:05 PM   Abbreviations: M myopia (nearsighted); A astigmatism; H hyperopia (farsighted); P presbyopia; Mrx spectacle prescription;  CTL contact lenses; OD right eye; OS left eye; OU both eyes  XT exotropia; ET esotropia; PEK punctate epithelial keratitis; PEE punctate epithelial erosions; DES dry eye syndrome; MGD meibomian gland dysfunction; ATs artificial tears; PFAT's preservative free artificial tears; NSC nuclear sclerotic cataract; PSC posterior subcapsular cataract; ERM epi-retinal membrane; PVD posterior vitreous detachment; RD retinal detachment; DM diabetes mellitus; DR diabetic retinopathy; NPDR non-proliferative diabetic retinopathy; PDR proliferative diabetic retinopathy; CSME clinically significant macular edema; DME diabetic macular edema; dbh dot blot hemorrhages; CWS cotton wool spot; POAG primary open angle glaucoma; C/D cup-to-disc ratio; HVF humphrey visual field; GVF goldmann visual field; OCT optical coherence tomography; IOP intraocular pressure; BRVO Branch retinal vein occlusion; CRVO central retinal vein occlusion; CRAO central retinal artery occlusion; BRAO branch retinal artery occlusion; RT retinal tear; SB scleral buckle; PPV pars plana vitrectomy; VH Vitreous hemorrhage; PRP panretinal laser photocoagulation; IVK intravitreal kenalog; VMT vitreomacular traction; MH Macular hole;  NVD neovascularization of the disc; NVE neovascularization elsewhere; AREDS age related eye disease study; ARMD age related  macular degeneration; POAG primary open angle glaucoma; EBMD epithelial/anterior basement membrane dystrophy; ACIOL anterior chamber intraocular  lens; IOL intraocular lens; PCIOL posterior chamber intraocular lens; Phaco/IOL phacoemulsification with intraocular lens placement; [place] photorefractive keratectomy; LASIK laser assisted in situ keratomileusis; HTN hypertension; DM diabetes mellitus; COPD chronic obstructive pulmonary disease

## 2020-10-28 ENCOUNTER — Other Ambulatory Visit: Payer: Self-pay

## 2020-10-28 ENCOUNTER — Encounter (INDEPENDENT_AMBULATORY_CARE_PROVIDER_SITE_OTHER): Payer: Self-pay | Admitting: Ophthalmology

## 2020-10-28 ENCOUNTER — Ambulatory Visit (INDEPENDENT_AMBULATORY_CARE_PROVIDER_SITE_OTHER): Payer: BC Managed Care – PPO | Admitting: Ophthalmology

## 2020-10-28 VITALS — BP 107/69 | HR 61

## 2020-10-28 DIAGNOSIS — H3582 Retinal ischemia: Secondary | ICD-10-CM

## 2020-10-28 DIAGNOSIS — H25813 Combined forms of age-related cataract, bilateral: Secondary | ICD-10-CM | POA: Diagnosis not present

## 2020-10-28 DIAGNOSIS — H3581 Retinal edema: Secondary | ICD-10-CM | POA: Diagnosis not present

## 2020-10-28 DIAGNOSIS — H35033 Hypertensive retinopathy, bilateral: Secondary | ICD-10-CM | POA: Diagnosis not present

## 2020-11-01 DIAGNOSIS — L814 Other melanin hyperpigmentation: Secondary | ICD-10-CM | POA: Diagnosis not present

## 2020-11-01 DIAGNOSIS — L905 Scar conditions and fibrosis of skin: Secondary | ICD-10-CM | POA: Diagnosis not present

## 2020-11-01 DIAGNOSIS — D1801 Hemangioma of skin and subcutaneous tissue: Secondary | ICD-10-CM | POA: Diagnosis not present

## 2020-11-01 DIAGNOSIS — D225 Melanocytic nevi of trunk: Secondary | ICD-10-CM | POA: Diagnosis not present

## 2020-12-06 NOTE — Progress Notes (Addendum)
[facility] - Clinic Note  12/09/2020     CHIEF COMPLAINT Patient presents for Retina Follow Up   HISTORY OF PRESENT ILLNESS: Danny Frost is a 50 y.o. male who presents to the clinic today for:   HPI     Retina Follow Up   Patient presents with  Other.  In right eye.  This started 6 weeks ago.  I, the attending physician,  performed the HPI with the patient and updated documentation appropriately.        Comments   Patient here for 6 weeks retina follow up for CWS OD. Patient states vision is fine. The issue is still there. Not as bad as was. The ghosting image still there. Hasn't totally gone away yet. No eye pain.       Last edited by [name], MD on 12/10/2020  4:31 PM.    Pt states his symptoms are still there, but "not as pronounced", pt states he has an appt with his PCP for blood work in about a month and will be checked for Factor V if anything comes back abnornal   Referring physician: [facility], [facility] [address] [place],  [place] [address]  HISTORICAL INFORMATION:   Selected notes from the MEDICAL RECORD NUMBER Referred by [name] for concern of retinal whitening and visual disturbances LEE:  Ocular Hx- PMH-    CURRENT MEDICATIONS: No current outpatient medications on file. (Ophthalmic Drugs)   No current facility-administered medications for this visit. (Ophthalmic Drugs)   No current outpatient medications on file. (Other)   No current facility-administered medications for this visit. (Other)    REVIEW OF SYSTEMS: ROS   Positive for: Eyes Last edited by [name], COA on 12/09/2020  9:55 AM.      ALLERGIES No Known Allergies  PAST MEDICAL HISTORY History reviewed. No pertinent past medical history. History reviewed. No pertinent surgical history.  FAMILY HISTORY History reviewed. No pertinent family history.  SOCIAL HISTORY Social History   Tobacco Use   Smoking status: Never          OPHTHALMIC EXAM:  Base Eye Exam     Visual Acuity (Snellen - Linear)       Right Left   Dist cc 20/20 20/20    Correction: Glasses         Tonometry (Tonopen, 9:52 AM)       Right Left   Pressure 16 16         Pupils       Dark Light Shape React APD   Right 4 3 Round Brisk None   Left 4 3 Round Brisk None         Visual Fields (Counting fingers)       Left Right    Full Full         Extraocular Movement       Right Left    Full, Ortho Full, Ortho         Neuro/Psych     Oriented x3: Yes   Mood/Affect: Normal         Dilation     Both eyes: 1.0% Mydriacyl, 2.5% Phenylephrine @ 9:52 AM           Slit Lamp and Fundus Exam     Slit Lamp Exam       Right Left   Lids/Lashes Meibomian gland dysfunction, Telangiectasia Meibomian gland dysfunction, Telangiectasia   Conjunctiva/Sclera White and quiet White and quiet  Cornea Trace tear film debris Trace tear film debris   Anterior Chamber Deep and quiet Deep and quiet   Iris Round and dilated Round and dilated   Lens 1+ Nuclear sclerosis, 1+ Cortical cataract 1+ Nuclear sclerosis, 1+ Cortical cataract   Vitreous Vitreous syneresis Vitreous syneresis         Fundus Exam       Right Left   Disc Pink and Sharp, mild tilt, mild PPA Pink and Sharp, mild tilt, mild PPA   C/D Ratio 0.2 0.2   Macula Flat, Good foveal reflex, RPE mottling, focal CWS/blot heme superior macula just inside ST arcades Flat, Good foveal reflex, RPE mottling, ?nevus temporal macula   Vessels Attenuated and Tortuous arterioles, mild Copper wiring Attenuated and Tortuous arterioles, mild Copper wiring, mild AV crossing changes   Periphery Attached, no heme    Attached, peripheral cystoid degeneration, No heme            Refraction     Wearing Rx       Sphere Cylinder Axis   Right -5.50 +1.25 126   Left -5.50 +1.25 034            IMAGING AND PROCEDURES  Imaging and Procedures for  12/09/2020  OCT, Retina - OU - Both Eyes       Right Eye Quality was good. Central Foveal Thickness: 297. Progression has improved. Findings include normal foveal contour, no IRF, no SRF, intraretinal hyper-reflective material (Focal IRHM superior macula along ST arcades caught on widefield - improving).   Left Eye Quality was good. Central Foveal Thickness: 296. Progression has no prior data. Findings include normal foveal contour, no IRF, no SRF, vitreomacular adhesion .   Notes *Images captured and stored on drive  Diagnosis / Impression:  NFP, no IRF/SRF OU OD: Focal IRHM superior macula along ST arcades caught on widefield -- CWS -- improving  Clinical management:  See below  Abbreviations: NFP - Normal foveal profile. CME - cystoid macular edema. PED - pigment epithelial detachment. IRF - intraretinal fluid. SRF - subretinal fluid. EZ - ellipsoid zone. ERM - epiretinal membrane. ORA - outer retinal atrophy. ORT - outer retinal tubulation. SRHM - subretinal hyper-reflective material. IRHM - intraretinal hyper-reflective material            ASSESSMENT/PLAN:    ICD-10-CM   1. Cotton wool spots  H35.81     2. Retinal hemorrhage of right eye  H35.61     3. Retinal ischemia  H35.82     4. Retinal edema  H35.81 OCT, Retina - OU - Both Eyes    5. Essential hypertension  I10     6. Hypertensive retinopathy of both eyes  H35.033     7. Combined forms of age-related cataract of both eyes  H25.813        1-4. Cotton Wool Spots, retinal ischemia OD  - pt with focal cotton wool spots just inside ST arcades--improved on exam, but new IRH noted 9.2.22  - ? Impending BRVO?  - pt reports history of visual disturbance in inferior visual field corresponding to focal CWS  - FA 7.22.22 shows delayed filling and mild focal hyperfluorescent staining of arteriole in area of CWS  - pt reports strong family history of blood clots and brother with recent diagnosis of Factor V Leiden  mutation -- pt to get tested himself via PCP  - discussed findings, prognosis  - no ophthalmic intervention recommended at this time -- will monitor CWS and  focal IRH, but encouraged pt to pursue systemic work up of possible hypercoagulability, given strong family history of blood clots   - f/u 2 months DFE, OCT  5,6. Hypertensive retinopathy OU - discussed importance of tight BP control - monitor   7. Mixed Cataract OU - The symptoms of cataract, surgical options, and treatments and risks were discussed with patient. - discussed diagnosis and progression - not yet visually significant - monitor   Ophthalmic Meds Ordered this visit:  No orders of the defined types were placed in this encounter.      Return in about 2 months (around 02/08/2021) for f/u CWS OD, DFE, OCT.  There are no Patient Instructions on file for this visit.   Explained the diagnoses, plan, and follow up with the patient and they expressed understanding.  Patient expressed understanding of the importance of proper follow up care.   This document serves as a record of services personally performed by Karie Chimera, MD, PhD. It was created on their behalf by Joni Reining, an ophthalmic technician. The creation of this record is the provider's dictation and/or activities during the visit.    Electronically signed by: Joni Reining COA, 12/10/20  4:58 PM  This document serves as a record of services personally performed by Karie Chimera, MD, PhD. It was created on their behalf by Glee Arvin. Manson Passey, OA an ophthalmic technician. The creation of this record is the provider's dictation and/or activities during the visit.    Electronically signed by: Glee Arvin. Manson Passey, [place] 09.02.2022 4:58 PM  Karie Chimera, M.D., Ph.D. Diseases & Surgery of the Retina and Vitreous [facility] 12/09/2020  I have reviewed the above documentation for accuracy and completeness, and I agree with the above. Karie Chimera, M.D., Ph.D. 12/10/20 4:58 PM  Abbreviations: M myopia (nearsighted); A astigmatism; H hyperopia (farsighted); P presbyopia; Mrx spectacle prescription;  CTL contact lenses; OD right eye; OS left eye; OU both eyes  XT exotropia; ET esotropia; PEK punctate epithelial keratitis; PEE punctate epithelial erosions; DES dry eye syndrome; MGD meibomian gland dysfunction; ATs artificial tears; PFAT's preservative free artificial tears; NSC nuclear sclerotic cataract; PSC posterior subcapsular cataract; ERM epi-retinal membrane; PVD posterior vitreous detachment; RD retinal detachment; DM diabetes mellitus; DR diabetic retinopathy; NPDR non-proliferative diabetic retinopathy; PDR proliferative diabetic retinopathy; CSME clinically significant macular edema; DME diabetic macular edema; dbh dot blot hemorrhages; CWS cotton wool spot; POAG primary open angle glaucoma; C/D cup-to-disc ratio; HVF humphrey visual field; GVF goldmann visual field; OCT optical coherence tomography; IOP intraocular pressure; BRVO Branch retinal vein occlusion; CRVO central retinal vein occlusion; CRAO central retinal artery occlusion; BRAO branch retinal artery occlusion; RT retinal tear; SB scleral buckle; PPV pars plana vitrectomy; VH Vitreous hemorrhage; PRP panretinal laser photocoagulation; IVK intravitreal kenalog; VMT vitreomacular traction; MH Macular hole;  NVD neovascularization of the disc; NVE neovascularization elsewhere; AREDS age related eye disease study; ARMD age related macular degeneration; POAG primary open angle glaucoma; EBMD epithelial/anterior basement membrane dystrophy; ACIOL anterior chamber intraocular lens; IOL intraocular lens; PCIOL posterior chamber intraocular lens; Phaco/IOL phacoemulsification with intraocular lens placement; PRK photorefractive keratectomy; LASIK laser assisted in situ keratomileusis; HTN hypertension; DM diabetes mellitus; COPD chronic obstructive pulmonary disease

## 2020-12-09 ENCOUNTER — Encounter (INDEPENDENT_AMBULATORY_CARE_PROVIDER_SITE_OTHER): Payer: Self-pay | Admitting: Ophthalmology

## 2020-12-09 ENCOUNTER — Other Ambulatory Visit: Payer: Self-pay

## 2020-12-09 ENCOUNTER — Ambulatory Visit (INDEPENDENT_AMBULATORY_CARE_PROVIDER_SITE_OTHER): Payer: BC Managed Care – PPO | Admitting: Ophthalmology

## 2020-12-09 VITALS — BP 105/70 | HR 84

## 2020-12-09 DIAGNOSIS — H3582 Retinal ischemia: Secondary | ICD-10-CM

## 2020-12-09 DIAGNOSIS — H25813 Combined forms of age-related cataract, bilateral: Secondary | ICD-10-CM

## 2020-12-09 DIAGNOSIS — I10 Essential (primary) hypertension: Secondary | ICD-10-CM

## 2020-12-09 DIAGNOSIS — H35033 Hypertensive retinopathy, bilateral: Secondary | ICD-10-CM

## 2020-12-09 DIAGNOSIS — H3561 Retinal hemorrhage, right eye: Secondary | ICD-10-CM | POA: Diagnosis not present

## 2020-12-09 DIAGNOSIS — H3581 Retinal edema: Secondary | ICD-10-CM

## 2020-12-10 ENCOUNTER — Encounter (INDEPENDENT_AMBULATORY_CARE_PROVIDER_SITE_OTHER): Payer: Self-pay | Admitting: Ophthalmology

## 2020-12-19 DIAGNOSIS — Z0000 Encounter for general adult medical examination without abnormal findings: Secondary | ICD-10-CM | POA: Diagnosis not present

## 2020-12-19 DIAGNOSIS — D696 Thrombocytopenia, unspecified: Secondary | ICD-10-CM | POA: Diagnosis not present

## 2020-12-19 DIAGNOSIS — I10 Essential (primary) hypertension: Secondary | ICD-10-CM | POA: Diagnosis not present

## 2020-12-19 DIAGNOSIS — E785 Hyperlipidemia, unspecified: Secondary | ICD-10-CM | POA: Diagnosis not present

## 2020-12-19 DIAGNOSIS — E039 Hypothyroidism, unspecified: Secondary | ICD-10-CM | POA: Diagnosis not present

## 2020-12-19 DIAGNOSIS — Z125 Encounter for screening for malignant neoplasm of prostate: Secondary | ICD-10-CM | POA: Diagnosis not present

## 2020-12-19 DIAGNOSIS — R5383 Other fatigue: Secondary | ICD-10-CM | POA: Diagnosis not present

## 2020-12-26 DIAGNOSIS — N401 Enlarged prostate with lower urinary tract symptoms: Secondary | ICD-10-CM | POA: Diagnosis not present

## 2020-12-26 DIAGNOSIS — I950 Idiopathic hypotension: Secondary | ICD-10-CM | POA: Diagnosis not present

## 2020-12-26 DIAGNOSIS — E785 Hyperlipidemia, unspecified: Secondary | ICD-10-CM | POA: Diagnosis not present

## 2020-12-26 DIAGNOSIS — Z0000 Encounter for general adult medical examination without abnormal findings: Secondary | ICD-10-CM | POA: Diagnosis not present

## 2021-02-03 NOTE — Progress Notes (Signed)
[facility] - Clinic Note  02/08/2021     CHIEF COMPLAINT Patient presents for Retina Follow Up   HISTORY OF PRESENT ILLNESS: Danny Frost is a 50 y.o. male who presents to the clinic today for:   HPI     Retina Follow Up   Patient presents with  Other.  In right Danny.  This started 2 months ago.  I, the attending physician,  performed the HPI with the patient and updated documentation appropriately.        Comments   Patient here for 2 months retina follow up for cotton wool spots OD. Patient states vision seems stable. No Danny pain.       Last edited by Danny Chris, MD on 02/11/2021  3:11 AM.    Pt states he has not had blood work drawn for Danny Frost yet, he states last time he was at his drs office and drew labs his dr didn't feel the need to test for it bc he isnt having any problems  Referring physician: [facility], [facility] [address] [place],  [place] [address]  HISTORICAL INFORMATION:   Selected notes from the MEDICAL RECORD NUMBER Referred by Dr. Baker Frost for concern of retinal whitening and visual disturbances LEE:  Ocular Hx- PMH-    CURRENT MEDICATIONS: No current outpatient medications on file. (Ophthalmic Drugs)   No current facility-administered medications for this visit. (Ophthalmic Drugs)   No current outpatient medications on file. (Other)   No current facility-administered medications for this visit. (Other)   REVIEW OF SYSTEMS: ROS   Positive for: Eyes Last edited by Danny Frost, COA on 02/08/2021 10:01 AM.     ALLERGIES No Known Allergies  PAST MEDICAL HISTORY History reviewed. No pertinent past medical history. History reviewed. No pertinent surgical history.  FAMILY HISTORY History reviewed. No pertinent family history.  SOCIAL HISTORY Social History   Tobacco Use   Smoking status: Never         OPHTHALMIC EXAM:  Base Danny Exam     Visual Acuity (Snellen - Linear)        Right Left   Dist cc 20/20 20/20    Correction: Glasses         Tonometry (Tonopen, 9:59 AM)       Right Left   Pressure 16 19         Pupils       Dark Light Shape React APD   Right 4 3 Round Brisk None   Left 4 3 Round Brisk None         Visual Fields (Counting fingers)       Left Right    Full Full         Extraocular Movement       Right Left    Full, Ortho Full, Ortho         Neuro/Psych     Oriented x3: Yes   Mood/Affect: Normal         Dilation     Both eyes: 1.0% Mydriacyl, 2.5% Phenylephrine @ 9:59 AM           Slit Lamp and Fundus Exam     Slit Lamp Exam       Right Left   Lids/Lashes Meibomian gland dysfunction, Telangiectasia Meibomian gland dysfunction, Telangiectasia   Conjunctiva/Sclera White and quiet White and quiet   Cornea Trace tear film debris Trace tear film debris   Anterior Chamber Deep and quiet  Deep and quiet   Iris Round and dilated Round and dilated   Lens 1+ Nuclear sclerosis, 1+ Cortical cataract 1+ Nuclear sclerosis, 1+ Cortical cataract   Vitreous Vitreous syneresis Vitreous syneresis         Fundus Exam       Right Left   Disc Pink and Sharp, mild tilt, mild PPA Pink and Sharp, mild tilt, mild PPA   C/D Ratio 0.2 0.2   Macula Flat, Good foveal reflex, RPE mottling, focal CWS -- converted to fading blot heme superior macula just inside ST arcades Flat, Good foveal reflex, RPE mottling, ?nevus temporal macula   Vessels Attenuated and Tortuous arterioles, mild Copper wiring Attenuated and Tortuous arterioles, mild Copper wiring, mild AV crossing changes   Periphery Attached, no heme    Attached, peripheral cystoid degeneration, No heme            Refraction     Wearing Rx       Sphere Cylinder Axis   Right -5.50 +1.25 126   Left -5.50 +1.25 034           IMAGING AND PROCEDURES  Imaging and Procedures for 02/08/2021  OCT, Retina - OU - Both Eyes       Right Danny Quality was  good. Central Foveal Thickness: 298. Progression has improved. Findings include normal foveal contour, no IRF, no SRF, intraretinal hyper-reflective material, vitreomacular adhesion (Focal IRHM superior macula along ST arcades caught on widefield - improving).   Left Danny Quality was good. Central Foveal Thickness: 299. Progression has been stable. Findings include normal foveal contour, no IRF, no SRF, vitreomacular adhesion .   Notes *Images captured and stored on drive  Diagnosis / Impression:  NFP, no IRF/SRF OU OD: Focal IRHM superior macula along ST arcades caught on widefield -- CWS -- improving  Clinical management:  See below  Abbreviations: NFP - Normal foveal profile. CME - cystoid macular edema. PED - pigment epithelial detachment. IRF - intraretinal fluid. SRF - subretinal fluid. EZ - ellipsoid zone. ERM - epiretinal membrane. ORA - outer retinal atrophy. ORT - outer retinal tubulation. SRHM - subretinal hyper-reflective material. IRHM - intraretinal hyper-reflective material            ASSESSMENT/PLAN:    ICD-10-CM   1. Cotton wool spots  H35.81 Factor V Frost Reflex to Danny Frost    2. Retinal edema  H35.81 OCT, Retina - OU - Both Eyes    3. Retinal hemorrhage of right Danny  H35.61 Factor V Frost Reflex to Danny Frost    4. Retinal ischemia  H35.82     5. Essential hypertension  I10     6. Hypertensive retinopathy of both eyes  H35.033     7. Combined forms of age-related cataract of both eyes  H25.813      1-4. Cotton Wool Spots, retinal ischemia OD  - pt with focal cotton wool spots just inside ST arcades--improved on exam, but new IRH/DBH noted 9.2.22  - ? Impending BRVO?  - pt reports history of visual disturbance in inferior visual field corresponding to focal CWS--improving  - FA 7.22.22 shows delayed filling and mild focal hyperfluorescent staining of arteriole in area of CWS  - pt reports strong family history of blood clots and brother with recent diagnosis of  Factor V Frost mutation  - discussed findings, prognosis  - no ophthalmic intervention recommended at this time -- will monitor CWS and focal Danny Frost - will check for Factor V Frost mutation --  given strong family history of blood clots and +Factor V Frost brother -- order placed and lab slip given to patient  - f/u in 2 months, DFE/OCT  5,6. Hypertensive retinopathy OU - discussed importance of tight BP control - monitor   7. Mixed Cataract OU - The symptoms of cataract, surgical options, and treatments and risks were discussed with patient. - discussed diagnosis and progression - not yet visually significant - monitor   Ophthalmic Meds Ordered this visit:  No orders of the defined types were placed in this encounter.    Return in about 2 months (around 04/10/2021) for f/u 2 months, CWS OD, DFE, OCT.  There are no Patient Instructions on file for this visit.   Explained the diagnoses, plan, and follow up with the patient and they expressed understanding.  Patient expressed understanding of the importance of proper follow up care.   This document serves as a record of services personally performed by Karie Chimera, MD, PhD. It was created on their behalf by De Blanch, an ophthalmic technician. The creation of this record is the provider's dictation and/or activities during the visit.    Electronically signed by: De Blanch, OA, 02/11/21  3:16 AM   This document serves as a record of services personally performed by Karie Chimera, MD, PhD. It was created on their behalf by Glee Arvin. Manson Passey, OA an ophthalmic technician. The creation of this record is the provider's dictation and/or activities during the visit.    Electronically signed by: Glee Arvin. Manson Passey, [place] 11.02.2022 3:16 AM  Karie Chimera, M.D., Ph.D. Diseases & Surgery of the Retina and Vitreous [facility]  I have reviewed the above documentation for accuracy and completeness, and I  agree with the above. Karie Chimera, M.D., Ph.D. 02/11/21 3:16 AM  Abbreviations: M myopia (nearsighted); A astigmatism; H hyperopia (farsighted); P presbyopia; Mrx spectacle prescription;  CTL contact lenses; OD right Danny; OS left Danny; OU both eyes  XT exotropia; ET esotropia; PEK punctate epithelial keratitis; PEE punctate epithelial erosions; DES dry Danny syndrome; MGD meibomian gland dysfunction; ATs artificial tears; PFAT's preservative free artificial tears; NSC nuclear sclerotic cataract; PSC posterior subcapsular cataract; ERM epi-retinal membrane; PVD posterior vitreous detachment; RD retinal detachment; DM diabetes mellitus; DR diabetic retinopathy; NPDR non-proliferative diabetic retinopathy; PDR proliferative diabetic retinopathy; CSME clinically significant macular edema; DME diabetic macular edema; dbh dot blot hemorrhages; CWS cotton wool spot; POAG primary open angle glaucoma; C/D cup-to-disc ratio; HVF humphrey visual field; GVF goldmann visual field; OCT optical coherence tomography; IOP intraocular pressure; BRVO Branch retinal vein occlusion; CRVO central retinal vein occlusion; CRAO central retinal artery occlusion; BRAO branch retinal artery occlusion; RT retinal tear; SB scleral buckle; PPV pars plana vitrectomy; VH Vitreous hemorrhage; PRP panretinal laser photocoagulation; IVK intravitreal kenalog; VMT vitreomacular traction; MH Macular hole;  NVD neovascularization of the disc; NVE neovascularization elsewhere; AREDS age related Danny disease study; ARMD age related macular degeneration; POAG primary open angle glaucoma; EBMD epithelial/anterior basement membrane dystrophy; ACIOL anterior chamber intraocular lens; IOL intraocular lens; PCIOL posterior chamber intraocular lens; Phaco/IOL phacoemulsification with intraocular lens placement; PRK photorefractive keratectomy; LASIK laser assisted in situ keratomileusis; HTN hypertension; DM diabetes mellitus; COPD chronic obstructive pulmonary  disease

## 2021-02-08 ENCOUNTER — Encounter (INDEPENDENT_AMBULATORY_CARE_PROVIDER_SITE_OTHER): Payer: Self-pay | Admitting: Ophthalmology

## 2021-02-08 ENCOUNTER — Ambulatory Visit (INDEPENDENT_AMBULATORY_CARE_PROVIDER_SITE_OTHER): Payer: BC Managed Care – PPO | Admitting: Ophthalmology

## 2021-02-08 ENCOUNTER — Other Ambulatory Visit: Payer: Self-pay

## 2021-02-08 DIAGNOSIS — H3581 Retinal edema: Secondary | ICD-10-CM

## 2021-02-08 DIAGNOSIS — I10 Essential (primary) hypertension: Secondary | ICD-10-CM | POA: Diagnosis not present

## 2021-02-08 DIAGNOSIS — H35033 Hypertensive retinopathy, bilateral: Secondary | ICD-10-CM | POA: Diagnosis not present

## 2021-02-08 DIAGNOSIS — H25813 Combined forms of age-related cataract, bilateral: Secondary | ICD-10-CM

## 2021-02-08 DIAGNOSIS — H3582 Retinal ischemia: Secondary | ICD-10-CM | POA: Diagnosis not present

## 2021-02-08 DIAGNOSIS — H3561 Retinal hemorrhage, right eye: Secondary | ICD-10-CM | POA: Diagnosis not present

## 2021-02-11 ENCOUNTER — Encounter (INDEPENDENT_AMBULATORY_CARE_PROVIDER_SITE_OTHER): Payer: Self-pay | Admitting: Ophthalmology

## 2021-04-06 NOTE — Progress Notes (Signed)
[facility] Note  04/12/2021     CHIEF COMPLAINT Patient presents for Retina Follow Up    HISTORY OF PRESENT ILLNESS: Danny Frost is a 50 y.o. male who presents to the clinic today for:   HPI     Retina Follow Up   Patient presents with  Other.  In right eye.  This started 2 months ago.  I, the attending physician,  performed the HPI with the patient and updated documentation appropriately.        Comments   Patient here for 2 months retina follow up for cotton wool spots OD. Patient states vision doing good. Has a new floater that showed up in OD 1 1/2 weeks ago. No eye pain.       Last edited by Bernarda Caffey, MD on 04/12/2021  1:41 PM.    Pt states he has developed a new floater in his right eye  Referring physician: Monna Fam, MD [place],  [place] [address]  HISTORICAL INFORMATION:   Selected notes from the MEDICAL RECORD NUMBER Referred by Dr. Quentin Ore for concern of retinal whitening and visual disturbances LEE:  Ocular Hx- PMH-    CURRENT MEDICATIONS: No current outpatient medications on file. (Ophthalmic Drugs)   No current facility-administered medications for this visit. (Ophthalmic Drugs)   No current outpatient medications on file. (Other)   No current facility-administered medications for this visit. (Other)   REVIEW OF SYSTEMS:   ALLERGIES No Known Allergies  PAST MEDICAL HISTORY History reviewed. No pertinent past medical history. History reviewed. No pertinent surgical history.  FAMILY HISTORY History reviewed. No pertinent family history.  SOCIAL HISTORY Social History   Tobacco Use   Smoking status: Never  Vaping Use   Vaping Use: Never used         OPHTHALMIC EXAM:  Base Eye Exam     Visual Acuity (Snellen - Linear)       Right Left   Dist cc 20/20 20/20    Correction: Glasses         Tonometry (Tonopen, 9:46 AM)       Right Left   Pressure 17 17          Pupils       Dark Light Shape React APD   Right 4 3 Round Brisk None   Left 4 3 Round Brisk None         Visual Fields (Counting fingers)       Left Right    Full Full         Extraocular Movement       Right Left    Full, Ortho Full, Ortho         Neuro/Psych     Oriented x3: Yes   Mood/Affect: Normal         Dilation     Both eyes: 1.0% Mydriacyl, 2.5% Phenylephrine @ 9:45 AM           Slit Lamp and Fundus Exam     Slit Lamp Exam       Right Left   Lids/Lashes Meibomian gland dysfunction, Telangiectasia Meibomian gland dysfunction, Telangiectasia   Conjunctiva/Sclera White and quiet White and quiet   Cornea Trace tear film debris Trace tear film debris   Anterior Chamber Deep and quiet Deep and quiet   Iris Round and dilated Round and dilated   Lens 1+ Nuclear sclerosis, 1+ Cortical cataract 1+ Nuclear sclerosis, 1+ Cortical  cataract   Anterior Vitreous Vitreous syneresis, focal vitreous condensation just below fading DBH Vitreous syneresis         Fundus Exam       Right Left   Disc Pink and Sharp, mild tilt, mild PPA Pink and Sharp, mild tilt, mild PPA   C/D Ratio 0.2 0.2   Macula Flat, Good foveal reflex, RPE mottling, focal CWS -- converted to fading blot heme superior macula just inside ST arcades Flat, Good foveal reflex, RPE mottling, ?nevus temporal macula -- stable   Vessels Attenuated and Tortuous arterioles, mild Copper wiring Attenuated and Tortuous arterioles, mild Copper wiring, mild AV crossing changes   Periphery Attached, paving stone degeneration inferiorly, No heme Attached, peripheral cystoid degeneration, No heme           Refraction     Wearing Rx       Sphere Cylinder Axis   Right -5.50 +1.25 126   Left -5.50 +1.25 034           IMAGING AND PROCEDURES  Imaging and Procedures for 04/12/2021  OCT, Retina - OU - Both Eyes       Right Eye Quality was good. Central Foveal Thickness: 295.  Progression has been stable. Findings include normal foveal contour, no IRF, no SRF, intraretinal hyper-reflective material, vitreomacular adhesion (Focal IRHM superior macula along ST arcades caught on widefield - improving).   Left Eye Quality was good. Central Foveal Thickness: 295. Progression has been stable. Findings include normal foveal contour, no IRF, no SRF, vitreomacular adhesion .   Notes *Images captured and stored on drive  Diagnosis / Impression:  NFP, no IRF/SRF OU OD: Focal IRHM superior macula along ST arcades caught on widefield -- CWS -- improving  Clinical management:  See below  Abbreviations: NFP - Normal foveal profile. CME - cystoid macular edema. PED - pigment epithelial detachment. IRF - intraretinal fluid. SRF - subretinal fluid. EZ - ellipsoid zone. ERM - epiretinal membrane. ORA - outer retinal atrophy. ORT - outer retinal tubulation. SRHM - subretinal hyper-reflective material. IRHM - intraretinal hyper-reflective material            ASSESSMENT/PLAN:    ICD-10-CM   1. Cotton wool spots  H35.81 OCT, Retina - OU - Both Eyes    2. Retinal hemorrhage of right eye  H35.61     3. Retinal ischemia  H35.82     4. Essential hypertension  I10     5. Hypertensive retinopathy of both eyes  H35.033     6. Combined forms of age-related cataract of both eyes  H25.813       1-3. Cotton Wool Spots, retinal ischemia OD  - pt with focal cotton wool spots just inside ST arcades--improved on exam, but new IRH/DBH noted 9.2.22  - ? Impending BRVO?  - pt reports history of visual disturbance in inferior visual field corresponding to focal CWS--improving  - FA 7.22.22 shows delayed filling and mild focal hyperfluorescent staining of arteriole in area of CWS  - pt reports strong family history of blood clots and brother with recent diagnosis of Factor V Leiden mutation  - discussed findings, prognosis  - no ophthalmic intervention recommended at this time -- will  monitor CWS and focal IRH - will check for Factor V Leiden mutation -- given strong family history of blood clots and +Factor V Leiden brother -- order placed and lab slip given to patient -- pt never had labs drawn - on exam today, CWS stably  improved and DBH almost completely resolved - no retinal or ophthalmic interventions indicated or recommended   - pt is cleared from a retina standpoint for release to [facility] and resumption of primary eye care  - pt can f/u here prn  4,5. Hypertensive retinopathy OU - discussed importance of tight BP control - monitor   6. Mixed Cataract OU - The symptoms of cataract, surgical options, and treatments and risks were discussed with patient. - discussed diagnosis and progression - not yet visually significant - monitor   Ophthalmic Meds Ordered this visit:  No orders of the defined types were placed in this encounter.     Return if symptoms worsen or fail to improve.  There are no Patient Instructions on file for this visit.   Explained the diagnoses, plan, and follow up with the patient and they expressed understanding.  Patient expressed understanding of the importance of proper follow up care.   This document serves as a record of services personally performed by Gardiner Sleeper, MD, PhD. It was created on their behalf by Orvan Falconer, an ophthalmic technician. The creation of this record is the provider's dictation and/or activities during the visit.    Electronically signed by: Orvan Falconer, OA, 04/12/21  1:44 PM  This document serves as a record of services personally performed by Gardiner Sleeper, MD, PhD. It was created on their behalf by San Jetty. Owens Shark, OA an ophthalmic technician. The creation of this record is the provider's dictation and/or activities during the visit.    Electronically signed by: San Jetty. Owens Shark, [place] 01.04.2023 1:44 PM  Gardiner Sleeper, M.D., Ph.D. Diseases & Surgery of the Retina and  Vitreous [facility]  I have reviewed the above documentation for accuracy and completeness, and I agree with the above. Gardiner Sleeper, M.D., Ph.D. 04/12/21 1:44 PM   Abbreviations: M myopia (nearsighted); A astigmatism; H hyperopia (farsighted); P presbyopia; Mrx spectacle prescription;  CTL contact lenses; OD right eye; OS left eye; OU both eyes  XT exotropia; ET esotropia; PEK punctate epithelial keratitis; PEE punctate epithelial erosions; DES dry eye syndrome; MGD meibomian gland dysfunction; ATs artificial tears; PFAT's preservative free artificial tears; [place] nuclear sclerotic cataract; PSC posterior subcapsular cataract; ERM epi-retinal membrane; PVD posterior vitreous detachment; RD retinal detachment; DM diabetes mellitus; DR diabetic retinopathy; NPDR non-proliferative diabetic retinopathy; PDR proliferative diabetic retinopathy; CSME clinically significant macular edema; DME diabetic macular edema; dbh dot blot hemorrhages; CWS cotton wool spot; POAG primary open angle glaucoma; C/D cup-to-disc ratio; HVF humphrey visual field; GVF goldmann visual field; OCT optical coherence tomography; IOP intraocular pressure; BRVO Branch retinal vein occlusion; CRVO central retinal vein occlusion; CRAO central retinal artery occlusion; BRAO branch retinal artery occlusion; RT retinal tear; SB scleral buckle; PPV pars plana vitrectomy; VH Vitreous hemorrhage; PRP panretinal laser photocoagulation; IVK intravitreal kenalog; VMT vitreomacular traction; MH Macular hole;  NVD neovascularization of the disc; NVE neovascularization elsewhere; AREDS age related eye disease study; ARMD age related macular degeneration; POAG primary open angle glaucoma; EBMD epithelial/anterior basement membrane dystrophy; ACIOL anterior chamber intraocular lens; IOL intraocular lens; PCIOL posterior chamber intraocular lens; Phaco/IOL phacoemulsification with intraocular lens placement; [place] photorefractive  keratectomy; LASIK laser assisted in situ keratomileusis; HTN hypertension; DM diabetes mellitus; COPD chronic obstructive pulmonary disease

## 2021-04-12 ENCOUNTER — Other Ambulatory Visit: Payer: Self-pay

## 2021-04-12 ENCOUNTER — Ambulatory Visit (INDEPENDENT_AMBULATORY_CARE_PROVIDER_SITE_OTHER): Payer: BC Managed Care – PPO | Admitting: Ophthalmology

## 2021-04-12 ENCOUNTER — Encounter (INDEPENDENT_AMBULATORY_CARE_PROVIDER_SITE_OTHER): Payer: Self-pay | Admitting: Ophthalmology

## 2021-04-12 DIAGNOSIS — H35033 Hypertensive retinopathy, bilateral: Secondary | ICD-10-CM

## 2021-04-12 DIAGNOSIS — H3561 Retinal hemorrhage, right eye: Secondary | ICD-10-CM

## 2021-04-12 DIAGNOSIS — H3582 Retinal ischemia: Secondary | ICD-10-CM | POA: Diagnosis not present

## 2021-04-12 DIAGNOSIS — I10 Essential (primary) hypertension: Secondary | ICD-10-CM | POA: Diagnosis not present

## 2021-04-12 DIAGNOSIS — H3581 Retinal edema: Secondary | ICD-10-CM

## 2021-04-12 DIAGNOSIS — H25813 Combined forms of age-related cataract, bilateral: Secondary | ICD-10-CM

## 2021-07-31 DIAGNOSIS — M25512 Pain in left shoulder: Secondary | ICD-10-CM | POA: Diagnosis not present

## 2021-08-01 ENCOUNTER — Ambulatory Visit (INDEPENDENT_AMBULATORY_CARE_PROVIDER_SITE_OTHER): Payer: BC Managed Care – PPO

## 2021-08-01 ENCOUNTER — Ambulatory Visit (INDEPENDENT_AMBULATORY_CARE_PROVIDER_SITE_OTHER): Payer: BC Managed Care – PPO | Admitting: Family Medicine

## 2021-08-01 VITALS — BP 122/78 | HR 76 | Ht 73.0 in | Wt 203.8 lb

## 2021-08-01 DIAGNOSIS — M5412 Radiculopathy, cervical region: Secondary | ICD-10-CM | POA: Diagnosis not present

## 2021-08-01 DIAGNOSIS — M542 Cervicalgia: Secondary | ICD-10-CM | POA: Diagnosis not present

## 2021-08-01 MED ORDER — GABAPENTIN 300 MG PO CAPS: 300.0000 mg | ORAL_CAPSULE | Freq: Three times a day (TID) | ORAL | 2 refills | Status: DC

## 2021-08-01 MED ORDER — PREDNISONE 50 MG PO TABS: ORAL_TABLET | ORAL | 0 refills | Status: DC

## 2021-08-01 NOTE — Progress Notes (Signed)
? ? ?  Subjective:   ? ?CC: L arm pain ? ?I, Christoper Fabian, LAT, ATC, am serving as scribe for Dr. Clementeen Graham. ? ?HPI: Pt is a 51 y/o male c/o L arm pain x 4/12 w/ insidious onset. Pt moved a piece of furniture and then went out of town and pain worsened. He locates his pain to L-side of neck, along the lateral aspect of the L upper arm. Pt notes the pain is "sore" but has maintained good L shoulder AROM. ? ?Neck pain: yes- when stretching ?Radiating: yes ?L UE paresthesias: yes- L thumb, decreased sensitivity ?L UE weakness: no ?Aggravating factors: cervical flexion ?Treatments tried: stretching, IBU ? ?Pertinent review of Systems: No fevers or chills ? ?Relevant historical information: Thrombocytopenia.  Patient works as an Tree surgeon. ? ? ?Objective:   ? ?Vitals:  ? 08/01/21 0808  ?BP: 122/78  ?Pulse: 76  ?SpO2: 98%  ? ?General: Well Developed, well nourished, and in no acute distress.  ? ?MSK: C-spine: Normal appearing ?Nontender midline. ?Mildly tender palpation left cervical paraspinal muscles and trapezius. ?Decreased motion pain with lateral flexion. ?Upper extremity strength is intact. ?Positive Spurling's test. ?Reflexes are intact. ? ?Left shoulder: Normal. ?Nontender. ?Normal shoulder motion and strength. ? ?Lab and Radiology Results ?X-ray images C-spine obtained today personally and independently interpreted ?No acute fractures.  No to trace degenerative changes. ?Await formal radiology review ? ? ? ?Impression and Recommendations:   ? ?Assessment and Plan: ?51 y.o. male with left cervical radiculopathy at C6 dermatomal pattern.  Fortunately he does not have weakness at this time but does have progressive numbness in the thumb associated with radiating pain.  Plan for prednisone and gabapentin.  Will refer to PT if not improving in the next week.  We will proceed to MRI if not improved in 6 weeks or if he has progressive neurologic symptoms such as worsening numbness or pain or worsening weakness.   Talked about treatment plan and precautions.  Recheck back in 6 weeks and contact me sooner based on how this goes.  Additionally we talked about ergonomics.. ? ?PDMP not reviewed this encounter. ?Orders Placed This Encounter  ?Procedures  ? DG Cervical Spine Complete  ?  Order Specific Question:   Reason for Exam (SYMPTOM  OR DIAGNOSIS REQUIRED)  ?  Answer:   neck pain  ?  Order Specific Question:   Preferred imaging location?  ?  Answer:   [facility]  ? ?Meds ordered this encounter  ?Medications  ? gabapentin (NEURONTIN) 300 MG capsule  ?  Sig: Take 1 capsule (300 mg total) by mouth 3 (three) times daily.  ?  Dispense:  30 capsule  ?  Refill:  2  ? predniSONE (DELTASONE) 50 MG tablet  ?  Sig: Take 1 pill daily for 5 days  ?  Dispense:  5 tablet  ?  Refill:  0  ? ? ?Discussed warning signs or symptoms. Please see discharge instructions. Patient expresses understanding. ? ? ?The above documentation has been reviewed and is accurate and complete Clementeen Graham, M.D. ? ?

## 2021-08-01 NOTE — Patient Instructions (Addendum)
Thank you for coming in today.  ? ?Please get an Xray today before you leave  ? ?I've sent a prescription for prednisone and gabpentin to your pharmacy.  ? ?I can order physical therapy if need and not getting better. ? ?Recheck back in 6 weeks ?

## 2021-08-02 NOTE — Progress Notes (Signed)
X-ray cervical spine shows some arthritis changes.  No acute fractures are visible.

## 2021-08-29 NOTE — Progress Notes (Signed)
[facility] Note  08/30/2021     CHIEF COMPLAINT Patient presents for Retina Evaluation    HISTORY OF PRESENT ILLNESS: Fode Gulino is a 51 y.o. male who presents to the clinic today for:   HPI     Retina Evaluation   In left eye.  This started 2 weeks ago.  Duration of 2 weeks.  I, the attending physician,  performed the HPI with the patient and updated documentation appropriately.        Comments   Patient here for Retina evaluation. Problem exam. Seeing spots OS. Patient states vision is ok. Feels developed spots in OS. In OD in the past had cotton wool spots. Thinks may be the same only in the OD. Noticed 2 weeks ago. Noticed a streak last week. Can't always see the spots. When on the computer or watching tv notices the spots. Also when it is a light overcast day notices them. No pressure or cloudy vision.       Last edited by Bernarda Caffey, MD on 08/30/2021  9:31 AM.    Pt states he is seeing a spot in his left eye that has been there for a couple weeks, he is also seeing occasional streaks of light OS  Referring physician: Deland Pretty, MD [place],   [id number]  HISTORICAL INFORMATION:   Selected notes from the MEDICAL RECORD NUMBER Referred by Dr. Quentin Ore for concern of retinal whitening and visual disturbances LEE:  Ocular Hx- PMH-    CURRENT MEDICATIONS: No current outpatient medications on file. (Ophthalmic Drugs)   No current facility-administered medications for this visit. (Ophthalmic Drugs)   Current Outpatient Medications (Other)  Medication Sig   gabapentin (NEURONTIN) 300 MG capsule Take 1 capsule (300 mg total) by mouth 3 (three) times daily.   predniSONE (DELTASONE) 50 MG tablet Take 1 pill daily for 5 days (Patient not taking: Reported on 08/30/2021)   No current facility-administered medications for this visit. (Other)   REVIEW OF SYSTEMS: ROS   Positive for: Eyes Last  edited by Theodore Demark, COA on 08/30/2021  9:11 AM.     ALLERGIES No Known Allergies  PAST MEDICAL HISTORY History reviewed. No pertinent past medical history. History reviewed. No pertinent surgical history.  FAMILY HISTORY History reviewed. No pertinent family history.  SOCIAL HISTORY Social History   Tobacco Use   Smoking status: Never  Vaping Use   Vaping Use: Never used       OPHTHALMIC EXAM:  Base Eye Exam     Visual Acuity (Snellen - Linear)       Right Left   Dist cc 20/20 20/20    Correction: Glasses         Tonometry (Tonopen, 9:05 AM)       Right Left   Pressure 15 16         Pupils       Dark Light Shape React APD   Right 4 3 Round Brisk None   Left 4 3 Round Brisk None         Visual Fields (Counting fingers)       Left Right    Full Full         Extraocular Movement       Right Left    Full, Ortho Full, Ortho         Neuro/Psych     Oriented x3: Yes   Mood/Affect: Normal  Dilation     Both eyes: 1.0% Mydriacyl, 2.5% Phenylephrine @ 9:05 AM           Slit Lamp and Fundus Exam     Slit Lamp Exam       Right Left   Lids/Lashes Meibomian gland dysfunction, Telangiectasia Meibomian gland dysfunction, Telangiectasia   Conjunctiva/Sclera White and quiet White and quiet   Cornea Trace tear film debris, trace inferior PEE Trace tear film debris, trace inferior PEE   Anterior Chamber Deep and quiet Deep and quiet   Iris Round and dilated Round and dilated   Lens 1+ Nuclear sclerosis, 1+ Cortical cataract 1+ Nuclear sclerosis, 1+ Cortical cataract   Anterior Vitreous Mild syneresis Vitreous syneresis         Fundus Exam       Right Left   Disc Pink and Sharp, mild tilt, mild PPA, Compact Pink and Sharp, mild tilt, mild PPA, Compact   C/D Ratio 0.2 0.3   Macula Flat, Good foveal reflex, RPE mottling, focal CWS -- converted to fading blot heme superior macula just inside ST arcades -- fully  resolved Flat, Good foveal reflex, RPE mottling, ?nevus temporal macula -- stable, ?punctate heme superior fovea, no edema   Vessels Attenuated and Tortuous arterioles, mild Copper wiring Attenuated, mild tortuosity   Periphery Attached, paving stone degeneration inferiorly, No heme Attached, peripheral cystoid degeneration, No heme           Refraction     Wearing Rx       Sphere Cylinder Axis   Right -5.50 +1.25 126   Left -5.50 +1.25 034           IMAGING AND PROCEDURES  Imaging and Procedures for 08/30/2021          ASSESSMENT/PLAN:    ICD-10-CM   1. Visual disturbance  H53.9     2. Vitreous syneresis of left eye  H43.392     3. Cotton wool spots  H35.81     4. Retinal hemorrhage of right eye  H35.61     5. Retinal ischemia  H35.82     6. Essential hypertension  I10     7. Hypertensive retinopathy of both eyes  H35.033     8. Combined forms of age-related cataract of both eyes  H25.813      1,2. Visual disturbance / vitreous syneresis OS  - pt previously releasee from our retina care who presents acutely for 2 wk history of "spots in vision" OS  - denies flashes of light  - by history, sounds like PVD / vitreous floaters / syneresis  - exam without frank PVD but +vitreous syneresis  - suspect etiology of symptoms are vitreous changes  - no other pathologies found on dilated exam  - discussed findings, thoughts and prognosis  - no retinal or ophthalmic interventions indicated or recommended   - f/u prn  3-5. Cotton Wool Spots, retinal ischemia OD  - pt with focal cotton wool spots just inside ST arcades--all stably resolved  - pt reports history of visual disturbance in inferior visual field corresponding to focal CWS--improving  - FA 7.22.22 shows delayed filling and mild focal hyperfluorescent staining of arteriole in area of CWS  - pt reports strong family history of blood clots and brother with recent diagnosis of Factor V Leiden mutation  -  discussed findings, prognosis  - no ophthalmic intervention recommended at this time -- will monitor CWS and focal [facility] - will check for Factor V  Leiden mutation -- given strong family history of blood clots and +Factor V Leiden brother -- order placed and lab slip given to patient -- pt never had labs drawn - on exam today, CWS stably improved and DBH resolved - no retinal or ophthalmic interventions indicated or recommended   - pt is cleared from a retina standpoint for release to [facility] and resumption of primary eye care  - pt can f/u here prn  6,7. Hypertensive retinopathy OU - discussed importance of tight BP control - monitor   8. Mixed Cataract OU - The symptoms of cataract, surgical options, and treatments and risks were discussed with patient. - discussed diagnosis and progression - not yet visually significant - monitor   Ophthalmic Meds Ordered this visit:  No orders of the defined types were placed in this encounter.     Return if symptoms worsen or fail to improve.  There are no Patient Instructions on file for this visit.   Explained the diagnoses, plan, and follow up with the patient and they expressed understanding.  Patient expressed understanding of the importance of proper follow up care.   This document serves as a record of services personally performed by Gardiner Sleeper, MD, PhD. It was created on their behalf by Leonie Douglas, an ophthalmic technician. The creation of this record is the provider's dictation and/or activities during the visit.    Electronically signed by: Leonie Douglas COA, 08/31/21  1:36 PM  This document serves as a record of services personally performed by Gardiner Sleeper, MD, PhD. It was created on their behalf by San Jetty. Owens Shark, OA an ophthalmic technician. The creation of this record is the provider's dictation and/or activities during the visit.    Electronically signed by: San Jetty. Owens Shark, [place] 05.24.2023 1:36 PM  Gardiner Sleeper, M.D., Ph.D. Diseases & Surgery of the Retina and Vitreous [facility]  I have reviewed the above documentation for accuracy and completeness, and I agree with the above. Gardiner Sleeper, M.D., Ph.D. 08/31/21 1:44 PM  Abbreviations: M myopia (nearsighted); A astigmatism; H hyperopia (farsighted); P presbyopia; Mrx spectacle prescription;  CTL contact lenses; OD right eye; OS left eye; OU both eyes  XT exotropia; ET esotropia; PEK punctate epithelial keratitis; PEE punctate epithelial erosions; DES dry eye syndrome; MGD meibomian gland dysfunction; ATs artificial tears; PFAT's preservative free artificial tears; [place] nuclear sclerotic cataract; PSC posterior subcapsular cataract; ERM epi-retinal membrane; PVD posterior vitreous detachment; RD retinal detachment; DM diabetes mellitus; DR diabetic retinopathy; NPDR non-proliferative diabetic retinopathy; PDR proliferative diabetic retinopathy; CSME clinically significant macular edema; DME diabetic macular edema; dbh dot blot hemorrhages; CWS cotton wool spot; POAG primary open angle glaucoma; C/D cup-to-disc ratio; HVF humphrey visual field; GVF goldmann visual field; OCT optical coherence tomography; IOP intraocular pressure; BRVO Branch retinal vein occlusion; CRVO central retinal vein occlusion; CRAO central retinal artery occlusion; BRAO branch retinal artery occlusion; RT retinal tear; SB scleral buckle; PPV pars plana vitrectomy; VH Vitreous hemorrhage; PRP panretinal laser photocoagulation; IVK intravitreal kenalog; VMT vitreomacular traction; MH Macular hole;  NVD neovascularization of the disc; NVE neovascularization elsewhere; AREDS age related eye disease study; ARMD age related macular degeneration; POAG primary open angle glaucoma; EBMD epithelial/anterior basement membrane dystrophy; ACIOL anterior chamber intraocular lens; IOL intraocular lens; PCIOL posterior chamber intraocular lens; Phaco/IOL phacoemulsification  with intraocular lens placement; [place] photorefractive keratectomy; LASIK laser assisted in situ keratomileusis; HTN hypertension; DM diabetes mellitus; COPD chronic obstructive pulmonary disease

## 2021-08-30 ENCOUNTER — Encounter (INDEPENDENT_AMBULATORY_CARE_PROVIDER_SITE_OTHER): Payer: Self-pay | Admitting: Ophthalmology

## 2021-08-30 ENCOUNTER — Ambulatory Visit (INDEPENDENT_AMBULATORY_CARE_PROVIDER_SITE_OTHER): Payer: BC Managed Care – PPO | Admitting: Ophthalmology

## 2021-08-30 DIAGNOSIS — H3581 Retinal edema: Secondary | ICD-10-CM | POA: Diagnosis not present

## 2021-08-30 DIAGNOSIS — H3561 Retinal hemorrhage, right eye: Secondary | ICD-10-CM

## 2021-08-30 DIAGNOSIS — H539 Unspecified visual disturbance: Secondary | ICD-10-CM

## 2021-08-30 DIAGNOSIS — H35033 Hypertensive retinopathy, bilateral: Secondary | ICD-10-CM

## 2021-08-30 DIAGNOSIS — H3582 Retinal ischemia: Secondary | ICD-10-CM

## 2021-08-30 DIAGNOSIS — H43392 Other vitreous opacities, left eye: Secondary | ICD-10-CM

## 2021-08-30 DIAGNOSIS — I10 Essential (primary) hypertension: Secondary | ICD-10-CM

## 2021-08-30 DIAGNOSIS — H25813 Combined forms of age-related cataract, bilateral: Secondary | ICD-10-CM

## 2021-09-12 ENCOUNTER — Ambulatory Visit: Payer: BC Managed Care – PPO | Admitting: Family Medicine

## 2021-09-13 ENCOUNTER — Ambulatory Visit: Payer: BC Managed Care – PPO | Admitting: Family Medicine

## 2021-09-14 NOTE — Progress Notes (Signed)
   I, Christoper Fabian, LAT, ATC, am serving as scribe for Dr. Clementeen Graham.  Danny Frost is a 51 y.o. male who presents to [facility] today for f/u of L-sided cervical radiculopathy including paresthesias in his L upper arm and L thumb.  He was last seen by Dr. Denyse Amass on 08/01/21 and was prescribed gabapentin and a short course of prednisone.  Today, pt reports that he is feeling better but will con't to have intermittent paresthesias in his L upper arm and L thumb.  He took the prednisone but did not take the gabapentin.  Diagnostic testing: C-spine XR- 08/01/21   Pertinent review of systems: No fevers or chills  Relevant historical information: BPH.  Thrombocytopenia.  Hyperlipidemia.   Exam:  BP 120/80 (BP Location: Right Arm, Patient Position: Sitting, Cuff Size: Normal)   Pulse 71   Ht 6\' 1"  (1.854 m)   Wt 201 lb 9.6 oz (91.4 kg)   SpO2 96%   BMI 26.60 kg/m  General: Well Developed, well nourished, and in no acute distress.   MSK: C-spine: Normal cervical motion.  Strength and reflexes are intact.    Lab and Radiology Results  DG Cervical Spine Complete  Result Date: 08/01/2021 CLINICAL DATA:  Neck pain on left side. Neck pain radiating into left arm for 13 days after moving a piece of furniture. EXAM: CERVICAL SPINE - COMPLETE 4+ VIEW COMPARISON:  None. FINDINGS: Cervical spine alignment is maintained. Vertebral body heights and intervertebral disc spaces are preserved. The dens is intact. Posterior elements appear well-aligned. Occasional facet hypertrophy, C4-C5 and C5-C6 on the left, C5-C6 on the right. There is no bony neural foraminal narrowing. There is no evidence of fracture. No prevertebral soft tissue edema. IMPRESSION: Mild bilateral facet hypertrophy. Otherwise negative radiographs of the cervical spine. No bony neural foraminal narrowing. Electronically Signed   By: 08/03/2021 M.D.   On: 08/01/2021 20:50   I, 08/03/2021, personally  (independently) visualized and performed the interpretation of the images attached in this note.     Assessment and Plan: 51 y.o. male with left cervical radiculopathy at C6 dermatomal pattern.  Symptoms improved with prednisone course and a bit of watchful waiting.  He still has some symptoms but but they are very minor.  Watchful waiting for now and recheck back as needed.  Precautions reviewed. Total encounter time 20 minutes including face-to-face time with the patient and, reviewing past medical record, and charting on the date of service.      Discussed warning signs or symptoms. Please see discharge instructions. Patient expresses understanding.   The above documentation has been reviewed and is accurate and complete 54, M.D.

## 2021-09-15 ENCOUNTER — Ambulatory Visit (INDEPENDENT_AMBULATORY_CARE_PROVIDER_SITE_OTHER): Payer: BC Managed Care – PPO | Admitting: Family Medicine

## 2021-09-15 VITALS — BP 120/80 | HR 71 | Ht 73.0 in | Wt 201.6 lb

## 2021-09-15 DIAGNOSIS — M5412 Radiculopathy, cervical region: Secondary | ICD-10-CM | POA: Insufficient documentation

## 2021-09-15 DIAGNOSIS — I059 Rheumatic mitral valve disease, unspecified: Secondary | ICD-10-CM | POA: Insufficient documentation

## 2021-09-15 DIAGNOSIS — J309 Allergic rhinitis, unspecified: Secondary | ICD-10-CM | POA: Insufficient documentation

## 2021-09-15 DIAGNOSIS — J45909 Unspecified asthma, uncomplicated: Secondary | ICD-10-CM | POA: Insufficient documentation

## 2021-09-15 DIAGNOSIS — I950 Idiopathic hypotension: Secondary | ICD-10-CM | POA: Insufficient documentation

## 2021-09-15 DIAGNOSIS — H9319 Tinnitus, unspecified ear: Secondary | ICD-10-CM | POA: Insufficient documentation

## 2021-09-15 DIAGNOSIS — D709 Neutropenia, unspecified: Secondary | ICD-10-CM | POA: Insufficient documentation

## 2021-09-15 DIAGNOSIS — N401 Enlarged prostate with lower urinary tract symptoms: Secondary | ICD-10-CM | POA: Insufficient documentation

## 2021-09-15 DIAGNOSIS — E785 Hyperlipidemia, unspecified: Secondary | ICD-10-CM | POA: Insufficient documentation

## 2021-09-15 DIAGNOSIS — E559 Vitamin D deficiency, unspecified: Secondary | ICD-10-CM | POA: Insufficient documentation

## 2021-09-15 DIAGNOSIS — E291 Testicular hypofunction: Secondary | ICD-10-CM | POA: Insufficient documentation

## 2021-09-15 NOTE — Patient Instructions (Addendum)
Good to see you today. ° °Follow-up as needed. °

## 2021-09-27 DIAGNOSIS — H40013 Open angle with borderline findings, low risk, bilateral: Secondary | ICD-10-CM | POA: Diagnosis not present

## 2021-09-27 DIAGNOSIS — H04123 Dry eye syndrome of bilateral lacrimal glands: Secondary | ICD-10-CM | POA: Diagnosis not present

## 2021-09-27 DIAGNOSIS — H00031 Abscess of right upper eyelid: Secondary | ICD-10-CM | POA: Diagnosis not present

## 2021-09-27 DIAGNOSIS — H00011 Hordeolum externum right upper eyelid: Secondary | ICD-10-CM | POA: Diagnosis not present

## 2021-09-27 DIAGNOSIS — H524 Presbyopia: Secondary | ICD-10-CM | POA: Diagnosis not present

## 2021-12-21 DIAGNOSIS — E559 Vitamin D deficiency, unspecified: Secondary | ICD-10-CM | POA: Diagnosis not present

## 2021-12-21 DIAGNOSIS — Z125 Encounter for screening for malignant neoplasm of prostate: Secondary | ICD-10-CM | POA: Diagnosis not present

## 2021-12-21 DIAGNOSIS — E785 Hyperlipidemia, unspecified: Secondary | ICD-10-CM | POA: Diagnosis not present

## 2021-12-21 DIAGNOSIS — D709 Neutropenia, unspecified: Secondary | ICD-10-CM | POA: Diagnosis not present

## 2021-12-28 DIAGNOSIS — N401 Enlarged prostate with lower urinary tract symptoms: Secondary | ICD-10-CM | POA: Diagnosis not present

## 2021-12-28 DIAGNOSIS — I950 Idiopathic hypotension: Secondary | ICD-10-CM | POA: Diagnosis not present

## 2021-12-28 DIAGNOSIS — E785 Hyperlipidemia, unspecified: Secondary | ICD-10-CM | POA: Diagnosis not present

## 2021-12-28 DIAGNOSIS — Z0000 Encounter for general adult medical examination without abnormal findings: Secondary | ICD-10-CM | POA: Diagnosis not present

## 2022-07-03 DIAGNOSIS — D225 Melanocytic nevi of trunk: Secondary | ICD-10-CM | POA: Diagnosis not present

## 2022-07-03 DIAGNOSIS — B078 Other viral warts: Secondary | ICD-10-CM | POA: Diagnosis not present

## 2022-07-03 DIAGNOSIS — L821 Other seborrheic keratosis: Secondary | ICD-10-CM | POA: Diagnosis not present

## 2022-07-03 DIAGNOSIS — L814 Other melanin hyperpigmentation: Secondary | ICD-10-CM | POA: Diagnosis not present

## 2022-10-02 DIAGNOSIS — H524 Presbyopia: Secondary | ICD-10-CM | POA: Diagnosis not present

## 2022-10-02 DIAGNOSIS — H04123 Dry eye syndrome of bilateral lacrimal glands: Secondary | ICD-10-CM | POA: Diagnosis not present

## 2022-10-02 DIAGNOSIS — H40013 Open angle with borderline findings, low risk, bilateral: Secondary | ICD-10-CM | POA: Diagnosis not present

## 2022-10-02 DIAGNOSIS — D3132 Benign neoplasm of left choroid: Secondary | ICD-10-CM | POA: Diagnosis not present

## 2022-10-02 DIAGNOSIS — H2513 Age-related nuclear cataract, bilateral: Secondary | ICD-10-CM | POA: Diagnosis not present

## 2022-11-14 DIAGNOSIS — L237 Allergic contact dermatitis due to plants, except food: Secondary | ICD-10-CM | POA: Diagnosis not present

## 2022-11-14 DIAGNOSIS — T887XXA Unspecified adverse effect of drug or medicament, initial encounter: Secondary | ICD-10-CM | POA: Diagnosis not present

## 2022-12-31 DIAGNOSIS — Z0000 Encounter for general adult medical examination without abnormal findings: Secondary | ICD-10-CM | POA: Diagnosis not present

## 2022-12-31 DIAGNOSIS — E785 Hyperlipidemia, unspecified: Secondary | ICD-10-CM | POA: Diagnosis not present

## 2022-12-31 DIAGNOSIS — E559 Vitamin D deficiency, unspecified: Secondary | ICD-10-CM | POA: Diagnosis not present

## 2022-12-31 DIAGNOSIS — Z125 Encounter for screening for malignant neoplasm of prostate: Secondary | ICD-10-CM | POA: Diagnosis not present

## 2022-12-31 DIAGNOSIS — D696 Thrombocytopenia, unspecified: Secondary | ICD-10-CM | POA: Diagnosis not present

## 2022-12-31 DIAGNOSIS — R5383 Other fatigue: Secondary | ICD-10-CM | POA: Diagnosis not present

## 2023-01-07 DIAGNOSIS — E785 Hyperlipidemia, unspecified: Secondary | ICD-10-CM | POA: Diagnosis not present

## 2023-01-07 DIAGNOSIS — N401 Enlarged prostate with lower urinary tract symptoms: Secondary | ICD-10-CM | POA: Diagnosis not present

## 2023-01-07 DIAGNOSIS — Z0000 Encounter for general adult medical examination without abnormal findings: Secondary | ICD-10-CM | POA: Diagnosis not present

## 2023-01-07 DIAGNOSIS — I950 Idiopathic hypotension: Secondary | ICD-10-CM | POA: Diagnosis not present

## 2023-01-07 DIAGNOSIS — Z23 Encounter for immunization: Secondary | ICD-10-CM | POA: Diagnosis not present

## 2023-03-26 DIAGNOSIS — Z23 Encounter for immunization: Secondary | ICD-10-CM | POA: Diagnosis not present

## 2023-07-05 DIAGNOSIS — D225 Melanocytic nevi of trunk: Secondary | ICD-10-CM | POA: Diagnosis not present

## 2023-07-05 DIAGNOSIS — L821 Other seborrheic keratosis: Secondary | ICD-10-CM | POA: Diagnosis not present

## 2023-07-05 DIAGNOSIS — L814 Other melanin hyperpigmentation: Secondary | ICD-10-CM | POA: Diagnosis not present

## 2023-07-05 DIAGNOSIS — L738 Other specified follicular disorders: Secondary | ICD-10-CM | POA: Diagnosis not present

## 2023-10-07 DIAGNOSIS — H2513 Age-related nuclear cataract, bilateral: Secondary | ICD-10-CM | POA: Diagnosis not present

## 2023-10-07 DIAGNOSIS — H40013 Open angle with borderline findings, low risk, bilateral: Secondary | ICD-10-CM | POA: Diagnosis not present

## 2023-10-07 DIAGNOSIS — D3132 Benign neoplasm of left choroid: Secondary | ICD-10-CM | POA: Diagnosis not present

## 2023-10-07 DIAGNOSIS — H01001 Unspecified blepharitis right upper eyelid: Secondary | ICD-10-CM | POA: Diagnosis not present

## 2023-10-07 DIAGNOSIS — H524 Presbyopia: Secondary | ICD-10-CM | POA: Diagnosis not present

## 2024-01-03 DIAGNOSIS — Z125 Encounter for screening for malignant neoplasm of prostate: Secondary | ICD-10-CM | POA: Diagnosis not present

## 2024-01-03 DIAGNOSIS — R350 Frequency of micturition: Secondary | ICD-10-CM | POA: Diagnosis not present

## 2024-01-03 DIAGNOSIS — E559 Vitamin D deficiency, unspecified: Secondary | ICD-10-CM | POA: Diagnosis not present

## 2024-01-03 DIAGNOSIS — Z0000 Encounter for general adult medical examination without abnormal findings: Secondary | ICD-10-CM | POA: Diagnosis not present

## 2024-01-03 DIAGNOSIS — D709 Neutropenia, unspecified: Secondary | ICD-10-CM | POA: Diagnosis not present

## 2024-01-03 DIAGNOSIS — I950 Idiopathic hypotension: Secondary | ICD-10-CM | POA: Diagnosis not present

## 2024-01-10 DIAGNOSIS — Z23 Encounter for immunization: Secondary | ICD-10-CM | POA: Diagnosis not present

## 2024-01-10 DIAGNOSIS — D709 Neutropenia, unspecified: Secondary | ICD-10-CM | POA: Diagnosis not present

## 2024-01-10 DIAGNOSIS — R350 Frequency of micturition: Secondary | ICD-10-CM | POA: Diagnosis not present

## 2024-01-10 DIAGNOSIS — E7800 Pure hypercholesterolemia, unspecified: Secondary | ICD-10-CM | POA: Diagnosis not present

## 2024-01-10 DIAGNOSIS — Z0000 Encounter for general adult medical examination without abnormal findings: Secondary | ICD-10-CM | POA: Diagnosis not present
